# Patient Record
Sex: Female | Born: 1995 | Hispanic: Yes | Marital: Single | Smoking: Former smoker
Health system: Southern US, Community
[De-identification: ages and names within clinical notes are randomized; demographics above are authoritative.]

## PROBLEM LIST (undated history)

## (undated) DIAGNOSIS — D649 Anemia, unspecified: Secondary | ICD-10-CM

## (undated) DIAGNOSIS — F4310 Post-traumatic stress disorder, unspecified: Secondary | ICD-10-CM

## (undated) DIAGNOSIS — J45909 Unspecified asthma, uncomplicated: Secondary | ICD-10-CM

## (undated) DIAGNOSIS — M797 Fibromyalgia: Secondary | ICD-10-CM

## (undated) DIAGNOSIS — F329 Major depressive disorder, single episode, unspecified: Secondary | ICD-10-CM

## (undated) DIAGNOSIS — E559 Vitamin D deficiency, unspecified: Secondary | ICD-10-CM

## (undated) DIAGNOSIS — E611 Iron deficiency: Secondary | ICD-10-CM

## (undated) DIAGNOSIS — K2970 Gastritis, unspecified, without bleeding: Secondary | ICD-10-CM

## (undated) DIAGNOSIS — F419 Anxiety disorder, unspecified: Secondary | ICD-10-CM

## (undated) DIAGNOSIS — F32A Depression, unspecified: Secondary | ICD-10-CM

## (undated) HISTORY — DX: Fibromyalgia: M79.7

## (undated) HISTORY — DX: Anxiety disorder, unspecified: F41.9

## (undated) HISTORY — DX: Iron deficiency: E61.1

## (undated) HISTORY — DX: Gastritis, unspecified, without bleeding: K29.70

## (undated) HISTORY — PX: NO PAST SURGERIES: SHX2092

## (undated) HISTORY — DX: Vitamin D deficiency, unspecified: E55.9

---

## 2015-08-11 ENCOUNTER — Encounter (HOSPITAL_COMMUNITY): Payer: Self-pay

## 2015-08-11 ENCOUNTER — Emergency Department (HOSPITAL_COMMUNITY): Payer: Self-pay

## 2015-08-11 ENCOUNTER — Emergency Department (HOSPITAL_COMMUNITY)
Admission: EM | Admit: 2015-08-11 | Discharge: 2015-08-11 | Disposition: A | Discharge status: Home / Self Care | Payer: Self-pay | Attending: Emergency Medicine | Admitting: Emergency Medicine

## 2015-08-11 DIAGNOSIS — Z3202 Encounter for pregnancy test, result negative: Secondary | ICD-10-CM | POA: Insufficient documentation

## 2015-08-11 DIAGNOSIS — R197 Diarrhea, unspecified: Secondary | ICD-10-CM | POA: Insufficient documentation

## 2015-08-11 DIAGNOSIS — J45909 Unspecified asthma, uncomplicated: Secondary | ICD-10-CM | POA: Insufficient documentation

## 2015-08-11 DIAGNOSIS — Z862 Personal history of diseases of the blood and blood-forming organs and certain disorders involving the immune mechanism: Secondary | ICD-10-CM | POA: Insufficient documentation

## 2015-08-11 DIAGNOSIS — F17210 Nicotine dependence, cigarettes, uncomplicated: Secondary | ICD-10-CM | POA: Insufficient documentation

## 2015-08-11 DIAGNOSIS — Z8659 Personal history of other mental and behavioral disorders: Secondary | ICD-10-CM | POA: Insufficient documentation

## 2015-08-11 DIAGNOSIS — R1084 Generalized abdominal pain: Secondary | ICD-10-CM | POA: Insufficient documentation

## 2015-08-11 DIAGNOSIS — R112 Nausea with vomiting, unspecified: Secondary | ICD-10-CM | POA: Insufficient documentation

## 2015-08-11 HISTORY — DX: Major depressive disorder, single episode, unspecified: F32.9

## 2015-08-11 HISTORY — DX: Anemia, unspecified: D64.9

## 2015-08-11 HISTORY — DX: Unspecified asthma, uncomplicated: J45.909

## 2015-08-11 HISTORY — DX: Post-traumatic stress disorder, unspecified: F43.10

## 2015-08-11 HISTORY — DX: Depression, unspecified: F32.A

## 2015-08-11 LAB — COMPREHENSIVE METABOLIC PANEL
ALT: 15 U/L (ref 14–54)
AST: 19 U/L (ref 15–41)
Albumin: 4.2 g/dL (ref 3.5–5.0)
Alkaline Phosphatase: 95 U/L (ref 38–126)
Anion gap: 12 (ref 5–15)
BUN: 11 mg/dL (ref 6–20)
CHLORIDE: 105 mmol/L (ref 101–111)
CO2: 22 mmol/L (ref 22–32)
CREATININE: 0.64 mg/dL (ref 0.44–1.00)
Calcium: 9.2 mg/dL (ref 8.9–10.3)
GFR calc non Af Amer: >60 mL/min (ref >60)
Glucose, Bld: 110 mg/dL — ABNORMAL HIGH (ref 65–99)
Potassium: 4.2 mmol/L (ref 3.5–5.1)
SODIUM: 139 mmol/L (ref 135–145)
Total Bilirubin: 0.6 mg/dL (ref 0.3–1.2)
Total Protein: 8.2 g/dL — ABNORMAL HIGH (ref 6.5–8.1)

## 2015-08-11 LAB — URINALYSIS, ROUTINE W REFLEX MICROSCOPIC
Bilirubin Urine: NEGATIVE
GLUCOSE, UA: NEGATIVE mg/dL
HGB URINE DIPSTICK: NEGATIVE
Ketones, ur: NEGATIVE mg/dL
LEUKOCYTES UA: NEGATIVE
Nitrite: NEGATIVE
PH: 5.5 (ref 5.0–8.0)
PROTEIN: NEGATIVE mg/dL
SPECIFIC GRAVITY, URINE: 1.03 (ref 1.005–1.030)

## 2015-08-11 LAB — CBC
HEMATOCRIT: 38.4 % (ref 36.0–46.0)
Hemoglobin: 12.1 g/dL (ref 12.0–15.0)
MCH: 21.4 pg — AB (ref 26.0–34.0)
MCHC: 31.5 g/dL (ref 30.0–36.0)
MCV: 68 fL — AB (ref 78.0–100.0)
PLATELETS: 421 10*3/uL — AB (ref 150–400)
RBC: 5.65 MIL/uL — AB (ref 3.87–5.11)
RDW: 15.2 % (ref 11.5–15.5)
WBC: 9.7 10*3/uL (ref 4.0–10.5)

## 2015-08-11 LAB — I-STAT BETA HCG BLOOD, ED (MC, WL, AP ONLY): I-stat hCG, quantitative: <5 m[IU]/mL (ref <5)

## 2015-08-11 LAB — LIPASE, BLOOD: LIPASE: 24 U/L (ref 11–51)

## 2015-08-11 MED ORDER — SODIUM CHLORIDE 0.9 % IV BOLUS (SEPSIS): 1000.0000 mL | Freq: Once | INTRAVENOUS | Status: DC

## 2015-08-11 MED ORDER — ONDANSETRON 4 MG PO TBDP
4.0000 mg | ORAL_TABLET | Freq: Once | ORAL | Status: DC | PRN
  Filled 2015-08-11: qty 1

## 2015-08-11 MED ORDER — FENTANYL CITRATE (PF) 100 MCG/2ML IJ SOLN
100.0000 ug | Freq: Once | INTRAMUSCULAR | Status: DC
Start: 2015-08-11 — End: 2015-08-11
  Filled 2015-08-11: qty 2

## 2015-08-11 MED ORDER — ONDANSETRON HCL 4 MG/2ML IJ SOLN
4.0000 mg | Freq: Once | INTRAMUSCULAR | Status: DC
  Filled 2015-08-11: qty 2

## 2015-08-11 MED ORDER — ONDANSETRON HCL 4 MG PO TABS: 4.0000 mg | ORAL_TABLET | Freq: Four times a day (QID) | ORAL | Status: DC

## 2015-08-11 MED ORDER — NORGESTIM-ETH ESTRAD TRIPHASIC 0.18/0.215/0.25 MG-35 MCG PO TABS: 1.0000 | ORAL_TABLET | Freq: Every day | ORAL | Status: DC

## 2015-08-11 NOTE — ED Notes (Signed)
Patient does not want an IV.

## 2015-08-11 NOTE — ED Notes (Signed)
Pt c/o generalized abdominal pain and n/v x 1 day and diarrhea starting this morning.  Pain score 4/10.  Pt reports movement and intake makes pain worse.  Pt has not taken anything for symptoms.

## 2015-08-11 NOTE — ED Notes (Signed)
I have attempted IV placement x 3 with no success.  Our C.N., Johna RolesJakeema will attempt shortly.

## 2015-08-11 NOTE — ED Notes (Signed)
Ultrasound machine at bedside

## 2015-08-11 NOTE — ED Provider Notes (Signed)
CSN: [id number]     Arrival date & time 08/11/15  1252 History   First MD Initiated Contact with Patient 08/11/15 1734     Chief Complaint  Patient presents with  . Abdominal Pain  . Emesis  . Diarrhea     (Consider location/radiation/quality/duration/timing/severity/associated sxs/prior Treatment) Patient is a 20 y.o. female presenting with abdominal pain.  Abdominal Pain Pain location:  Generalized Pain quality: aching   Pain quality: not shooting, not squeezing and no stiffness   Pain radiates to:  Does not radiate Pain severity:  Mild Onset quality:  Gradual Timing:  Constant Progression:  Worsening Chronicity:  New Context: not alcohol use, not diet changes, not recent travel and not retching   Relieved by:  None tried Worsened by:  Nothing tried Ineffective treatments:  None tried Associated symptoms: diarrhea, nausea and vomiting   Associated symptoms: no chest pain, no cough, no dysuria, no fever and no shortness of breath     Past Medical History  Diagnosis Date  . PTSD (post-traumatic stress disorder)   . Bipolar 2 disorder (HCC)   . Depression   . Generalized anxiety disorder   . Asthma   . Anemia    History reviewed. No pertinent past surgical history. History reviewed. No pertinent family history. Social History  Substance Use Topics  . Smoking status: Current Every Day Smoker    Types: Cigarettes  . Smokeless tobacco: None  . Alcohol Use: Yes   OB History    No data available     Review of Systems  Constitutional: Negative for fever.  HENT: Negative for congestion and facial swelling.   Eyes: Negative for discharge and redness.  Respiratory: Negative for cough and shortness of breath.   Cardiovascular: Negative for chest pain.  Gastrointestinal: Positive for nausea, vomiting, abdominal pain and diarrhea. Negative for abdominal distention.  Endocrine: Negative for polydipsia.  Genitourinary: Negative for dysuria.  Musculoskeletal: Negative for  back pain.  Skin: Negative for wound.  Neurological: Negative for headaches.  All other systems reviewed and are negative.     Allergies  Review of patient's allergies indicates no known allergies.  Home Medications   Prior to Admission medications   Medication Sig Start Date End Date Taking? Authorizing Provider  Norgestimate-Ethinyl Estradiol Triphasic (ORTHO TRI-CYCLEN, 28,) 0.18/0.215/0.25 MG-35 MCG tablet Take 1 tablet by mouth daily. 08/11/15   Marily Memos, MD  ondansetron (ZOFRAN) 4 MG tablet Take 1 tablet (4 mg total) by mouth every 6 (six) hours. 08/11/15   Marily Memos, MD   BP 119/78 mmHg  Pulse 94  Temp(Src) 98.6 F (37 C) (Oral)  Resp 16  SpO2 100%  LMP 08/09/2015 Physical Exam  Constitutional: She is oriented to person, place, and time. She appears well-developed and well-nourished.  HENT:  Head: Normocephalic and atraumatic.  Neck: Normal range of motion.  Cardiovascular: Normal rate and regular rhythm.   Pulmonary/Chest: Effort normal and breath sounds normal. No stridor. No respiratory distress. She has no wheezes. She has no rales.  Abdominal: Soft. Bowel sounds are normal. She exhibits no distension. There is tenderness. There is no rebound and no guarding.  Musculoskeletal: Normal range of motion. She exhibits no edema or tenderness.  Neurological: She is alert and oriented to person, place, and time. No cranial nerve deficit.  Skin: Skin is warm and dry.  Nursing note and vitals reviewed.   ED Course  Procedures (including critical care time) Labs Review Labs Reviewed  COMPREHENSIVE METABOLIC PANEL - Abnormal; Notable for  the following:    Glucose, Bld 110 (*)    Total Protein 8.2 (*)    All other components within normal limits  CBC - Abnormal; Notable for the following:    RBC 5.65 (*)    MCV 68.0 (*)    MCH 21.4 (*)    Platelets 421 (*)    All other components within normal limits  URINALYSIS, ROUTINE W REFLEX MICROSCOPIC (NOT AT [facility]) -  Abnormal; Notable for the following:    Color, Urine AMBER (*)    APPearance CLOUDY (*)    All other components within normal limits  LIPASE, BLOOD  I-STAT BETA HCG BLOOD, ED ([facility], [facility], [facility] ONLY)    Imaging Review No results found. I have personally reviewed and evaluated these images and lab results as part of my medical decision-making.   EKG Interpretation None      MDM   Final diagnoses:  Non-intractable vomiting with nausea, vomiting of unspecified type  Diarrhea, unspecified type   Pain then vomiting and diarrhea. Abdomen ttp epigastric, ruq, luq. Unsure of cause. Labs ok. But 2/2 ttp will ct to ensure no surgical causes for symptoms.   Symptoms resolved. Abdomen exam improved. Patient not wanting to stay longer or be stuck for IV again. Likely gastroenteritis with resolution. Will return here for new/worsening symptoms.   New Prescriptions: New Prescriptions   NORGESTIMATE-ETHINYL ESTRADIOL TRIPHASIC (ORTHO TRI-CYCLEN, 28,) 0.18/0.215/0.25 MG-35 MCG TABLET    Take 1 tablet by mouth daily.   ONDANSETRON (ZOFRAN) 4 MG TABLET    Take 1 tablet (4 mg total) by mouth every 6 (six) hours.   I have personally and contemperaneously reviewed labs and imaging and used in my decision making as above.   A medical screening exam was performed and I feel the patient has had an appropriate workup for their chief complaint at this time and likelihood of emergent condition existing is low. Their vital signs are stable. They have been counseled on decision, discharge, follow up and which symptoms necessitate immediate return to the emergency department.  They verbally stated understanding and agreement with plan and discharged in stable condition.      Marily MemosJason Hermila Millis, MD 08/11/15 2027

## 2015-08-29 ENCOUNTER — Encounter (HOSPITAL_COMMUNITY): Payer: Self-pay | Admitting: Emergency Medicine

## 2015-08-29 ENCOUNTER — Emergency Department (HOSPITAL_COMMUNITY)
Admission: EM | Admit: 2015-08-29 | Discharge: 2015-08-30 | Disposition: A | Discharge status: Home / Self Care | Payer: Self-pay | Attending: Emergency Medicine | Admitting: Emergency Medicine

## 2015-08-29 DIAGNOSIS — J45909 Unspecified asthma, uncomplicated: Secondary | ICD-10-CM | POA: Insufficient documentation

## 2015-08-29 DIAGNOSIS — R8281 Pyuria: Secondary | ICD-10-CM

## 2015-08-29 DIAGNOSIS — Z862 Personal history of diseases of the blood and blood-forming organs and certain disorders involving the immune mechanism: Secondary | ICD-10-CM | POA: Insufficient documentation

## 2015-08-29 DIAGNOSIS — M545 Low back pain, unspecified: Secondary | ICD-10-CM

## 2015-08-29 DIAGNOSIS — F17210 Nicotine dependence, cigarettes, uncomplicated: Secondary | ICD-10-CM | POA: Insufficient documentation

## 2015-08-29 DIAGNOSIS — Z3202 Encounter for pregnancy test, result negative: Secondary | ICD-10-CM | POA: Insufficient documentation

## 2015-08-29 DIAGNOSIS — Z79818 Long term (current) use of other agents affecting estrogen receptors and estrogen levels: Secondary | ICD-10-CM | POA: Insufficient documentation

## 2015-08-29 DIAGNOSIS — R739 Hyperglycemia, unspecified: Secondary | ICD-10-CM

## 2015-08-29 DIAGNOSIS — Z8659 Personal history of other mental and behavioral disorders: Secondary | ICD-10-CM | POA: Insufficient documentation

## 2015-08-29 DIAGNOSIS — N390 Urinary tract infection, site not specified: Secondary | ICD-10-CM | POA: Insufficient documentation

## 2015-08-29 NOTE — ED Notes (Signed)
Pt c/o low back pain, body aches, chills and urinary frequency

## 2015-08-30 LAB — URINALYSIS, ROUTINE W REFLEX MICROSCOPIC
Bilirubin Urine: NEGATIVE
GLUCOSE, UA: NEGATIVE mg/dL
Hgb urine dipstick: NEGATIVE
Ketones, ur: NEGATIVE mg/dL
Nitrite: NEGATIVE
PH: 6 (ref 5.0–8.0)
PROTEIN: NEGATIVE mg/dL
Specific Gravity, Urine: 1.019 (ref 1.005–1.030)

## 2015-08-30 LAB — BASIC METABOLIC PANEL
Anion gap: 6 (ref 5–15)
BUN: 12 mg/dL (ref 6–20)
CHLORIDE: 110 mmol/L (ref 101–111)
CO2: 22 mmol/L (ref 22–32)
CREATININE: 0.6 mg/dL (ref 0.44–1.00)
Calcium: 8.6 mg/dL — ABNORMAL LOW (ref 8.9–10.3)
GFR calc Af Amer: >60 mL/min (ref >60)
GFR calc non Af Amer: >60 mL/min (ref >60)
GLUCOSE: 122 mg/dL — AB (ref 65–99)
POTASSIUM: 3.8 mmol/L (ref 3.5–5.1)
Sodium: 138 mmol/L (ref 135–145)

## 2015-08-30 LAB — CBC WITH DIFFERENTIAL/PLATELET
Basophils Absolute: 0 10*3/uL (ref 0.0–0.1)
Basophils Relative: 0 %
EOS ABS: 0.5 10*3/uL (ref 0.0–0.7)
EOS PCT: 4 %
HCT: 33.9 % — ABNORMAL LOW (ref 36.0–46.0)
Hemoglobin: 10.8 g/dL — ABNORMAL LOW (ref 12.0–15.0)
LYMPHS ABS: 2.8 10*3/uL (ref 0.7–4.0)
Lymphocytes Relative: 21 %
MCH: 21.6 pg — AB (ref 26.0–34.0)
MCHC: 31.9 g/dL (ref 30.0–36.0)
MCV: 67.8 fL — ABNORMAL LOW (ref 78.0–100.0)
MONO ABS: 0.5 10*3/uL (ref 0.1–1.0)
MONOS PCT: 4 %
Neutro Abs: 9.5 10*3/uL — ABNORMAL HIGH (ref 1.7–7.7)
Neutrophils Relative %: 71 %
PLATELETS: 413 10*3/uL — AB (ref 150–400)
RBC: 5 MIL/uL (ref 3.87–5.11)
RDW: 15.8 % — AB (ref 11.5–15.5)
WBC: 13.3 10*3/uL — AB (ref 4.0–10.5)

## 2015-08-30 LAB — URINE MICROSCOPIC-ADD ON: RBC / HPF: NONE SEEN RBC/hpf (ref 0–5)

## 2015-08-30 LAB — I-STAT BETA HCG BLOOD, ED (MC, WL, AP ONLY)

## 2015-08-30 MED ORDER — CYCLOBENZAPRINE HCL 10 MG PO TABS: 10.0000 mg | ORAL_TABLET | Freq: Two times a day (BID) | ORAL | Status: DC | PRN

## 2015-08-30 MED ORDER — NAPROXEN 500 MG PO TABS: 500.0000 mg | ORAL_TABLET | Freq: Two times a day (BID) | ORAL | Status: DC

## 2015-08-30 NOTE — ED Provider Notes (Signed)
CSN: [id number]     Arrival date & time 08/29/15  2321 History   First MD Initiated Contact with Patient 08/29/15 2341     Chief Complaint  Patient presents with  . Urinary Frequency     (Consider location/radiation/quality/duration/timing/severity/associated sxs/prior Treatment) HPI   Wanda Garrett is a(n) 20 y.o. female who presents to the ED with cc of back pain and urinary frequency.  The patient states that she is, she had low back pain for the past week. She states that it's worse with standing, better with sitting. She also has urinary frequency and some dysuria. Patient states she's developed some body aches and has noticed some foul odor of her urine. Patient denies any recent injuries to her back. She denies a history of problems with her back. She denies fevers. She denies any vaginal symptoms  Past Medical History  Diagnosis Date  . PTSD (post-traumatic stress disorder)   . Bipolar 2 disorder (HCC)   . Depression   . Generalized anxiety disorder   . Asthma   . Anemia    History reviewed. No pertinent past surgical history. No family history on file. Social History  Substance Use Topics  . Smoking status: Current Every Day Smoker    Types: Cigarettes  . Smokeless tobacco: None  . Alcohol Use: Yes   OB History    No data available     Review of Systems  Ten systems reviewed and are negative for acute change, except as noted in the HPI.     Allergies  Review of patient's allergies indicates no known allergies.  Home Medications   Prior to Admission medications   Medication Sig Start Date End Date Taking? Authorizing Provider  Norgestimate-Ethinyl Estradiol Triphasic (ORTHO TRI-CYCLEN, 28,) 0.18/0.215/0.25 MG-35 MCG tablet Take 1 tablet by mouth daily. 08/11/15   Marily MemosJason Mesner, MD  ondansetron (ZOFRAN) 4 MG tablet Take 1 tablet (4 mg total) by mouth every 6 (six) hours. 08/11/15   Marily MemosJason Mesner, MD   BP 119/87 mmHg  Pulse 100  Temp(Src) 98 F (36.7 C) (Oral)   Resp 20  Ht 5\' 5"  (1.651 m)  Wt 113.399 kg  BMI 41.60 kg/m2  SpO2 99%  LMP 08/09/2015 Physical Exam  Constitutional: She is oriented to person, place, and time. She appears well-developed and well-nourished. No distress.  obese  HENT:  Head: Normocephalic and atraumatic.  Eyes: Conjunctivae are normal. No scleral icterus.  Neck: Normal range of motion.  Cardiovascular: Normal rate, regular rhythm and normal heart sounds.  Exam reveals no gallop and no friction rub.   No murmur heard. Pulmonary/Chest: Effort normal and breath sounds normal. No respiratory distress.  Abdominal: Soft. Bowel sounds are normal. She exhibits no distension and no mass. There is no tenderness. There is no guarding.  Musculoskeletal:  TTP lumbar paraspinal muscles.  Neurological: She is alert and oriented to person, place, and time.  Skin: Skin is warm and dry. She is not diaphoretic.  Nursing note and vitals reviewed.   ED Course  Procedures (including critical care time) Labs Review Labs Reviewed  BASIC METABOLIC PANEL - Abnormal; Notable for the following:    Glucose, Bld 122 (*)    Calcium 8.6 (*)    All other components within normal limits  CBC WITH DIFFERENTIAL/PLATELET - Abnormal; Notable for the following:    WBC 13.3 (*)    Hemoglobin 10.8 (*)    HCT 33.9 (*)    MCV 67.8 (*)    MCH 21.6 (*)  RDW 15.8 (*)    Platelets 413 (*)    Neutro Abs 9.5 (*)    All other components within normal limits  URINALYSIS, ROUTINE W REFLEX MICROSCOPIC (NOT AT [facility]) - Abnormal; Notable for the following:    Leukocytes, UA SMALL (*)    All other components within normal limits  URINE MICROSCOPIC-ADD ON - Abnormal; Notable for the following:    Squamous Epithelial / LPF 0-5 (*)    Bacteria, UA RARE (*)    All other components within normal limits  I-STAT BETA HCG BLOOD, ED ([facility], [facility], [facility] ONLY)    Imaging Review No results found. I have personally reviewed and evaluated these images and lab results as  part of my medical decision-making.   EKG Interpretation None      MDM   Final diagnoses:  None    Patient with back pain.  No neurological deficits and normal neuro exam.  Patient can walk but states is painful.  No loss of bowel or bladder control.  No concern for cauda equina.  No fever, night sweats, weight loss, h/o cancer, IVDU.  RICE protocol and pain medicine indicated and discussed with patient.  +for pyuria. Will treat for infection. UA sent for culture. Appears safe for discharge.    Arthor Captain, PA-C 08/30/15 0454  Derwood Kaplan, MD 08/30/15 [phone]

## 2015-08-30 NOTE — ED Notes (Signed)
PA at bedside.

## 2015-08-30 NOTE — ED Notes (Signed)
Patient c/o low back pain and urinary frequency with a sensation of fullness to her bladder x1 week. Denies pain with urination. Patient reports a hx of UTI and describes this as a similar pain, with the difference being the generalized body aches. Denies fevers. Patient using cranberry and tylenol at home for symptom relief.

## 2015-08-30 NOTE — ED Notes (Signed)
Patient d/c'd self care.  F/U and medications reviewed.  Patient verbalized understanding. 

## 2015-08-30 NOTE — Discharge Instructions (Signed)
Back Exercises The following exercises strengthen the muscles that help to support the back. They also help to keep the lower back flexible. Doing these exercises can help to prevent back pain or lessen existing pain. If you have back pain or discomfort, try doing these exercises 2-3 times each day or as told by your health care provider. When the pain goes away, do them once each day, but increase the number of times that you repeat the steps for each exercise (do more repetitions). If you do not have back pain or discomfort, do these exercises once each day or as told by your health care provider. EXERCISES Single Knee to Chest Repeat these steps 3-5 times for each leg:  Lie on your back on a firm bed or the floor with your legs extended.  Bring one knee to your chest. Your other leg should stay extended and in contact with the floor.  Hold your knee in place by grabbing your knee or thigh.  Pull on your knee until you feel a gentle stretch in your lower back.  Hold the stretch for 10-30 seconds.  Slowly release and straighten your leg. Pelvic Tilt Repeat these steps 5-10 times:  Lie on your back on a firm bed or the floor with your legs extended.  Bend your knees so they are pointing toward the ceiling and your feet are flat on the floor.  Tighten your lower abdominal muscles to press your lower back against the floor. This motion will tilt your pelvis so your tailbone points up toward the ceiling instead of pointing to your feet or the floor.  With gentle tension and even breathing, hold this position for 5-10 seconds. Cat-Cow Repeat these steps until your lower back becomes more flexible:  Get into a hands-and-knees position on a firm surface. Keep your hands under your shoulders, and keep your knees under your hips. You may place padding under your knees for comfort.  Let your head hang down, and point your tailbone toward the floor so your lower back becomes rounded like the  back of a cat.  Hold this position for 5 seconds.  Slowly lift your head and point your tailbone up toward the ceiling so your back forms a sagging arch like the back of a cow.  Hold this position for 5 seconds. Press-Ups Repeat these steps 5-10 times:  Lie on your abdomen (face-down) on the floor.  Place your palms near your head, about shoulder-width apart.  While you keep your back as relaxed as possible and keep your hips on the floor, slowly straighten your arms to raise the top half of your body and lift your shoulders. Do not use your back muscles to raise your upper torso. You may adjust the placement of your hands to make yourself more comfortable.  Hold this position for 5 seconds while you keep your back relaxed.  Slowly return to lying flat on the floor. Bridges Repeat these steps 10 times:  Lie on your back on a firm surface.  Bend your knees so they are pointing toward the ceiling and your feet are flat on the floor.  Tighten your buttocks muscles and lift your buttocks off of the floor until your waist is at almost the same height as your knees. You should feel the muscles working in your buttocks and the back of your thighs. If you do not feel these muscles, slide your feet 1-2 inches farther away from your buttocks.  Hold this position for 3-5  seconds.  Slowly lower your hips to the starting position, and allow your buttocks muscles to relax completely. If this exercise is too easy, try doing it with your arms crossed over your chest. Abdominal Crunches Repeat these steps 5-10 times:  Lie on your back on a firm bed or the floor with your legs extended.  Bend your knees so they are pointing toward the ceiling and your feet are flat on the floor.  Cross your arms over your chest.  Tip your chin slightly toward your chest without bending your neck.  Tighten your abdominal muscles and slowly raise your trunk (torso) high enough to lift your shoulder blades a  tiny bit off of the floor. Avoid raising your torso higher than that, because it can put too much stress on your low back and it does not help to strengthen your abdominal muscles.  Slowly return to your starting position. Back Lifts Repeat these steps 5-10 times: 1. Lie on your abdomen (face-down) with your arms at your sides, and rest your forehead on the floor. 2. Tighten the muscles in your legs and your buttocks. 3. Slowly lift your chest off of the floor while you keep your hips pressed to the floor. Keep the back of your head in line with the curve in your back. Your eyes should be looking at the floor. 4. Hold this position for 3-5 seconds. 5. Slowly return to your starting position. SEEK MEDICAL CARE IF:  Your back pain or discomfort gets much worse when you do an exercise.  Your back pain or discomfort does not lessen within 2 hours after you exercise. If you have any of these problems, stop doing these exercises right away. Do not do them again unless your health care provider says that you can. SEEK IMMEDIATE MEDICAL CARE IF:  You develop sudden, severe back pain. If this happens, stop doing the exercises right away. Do not do them again unless your health care provider says that you can.   This information is not intended to replace advice given to you by your health care provider. Make sure you discuss any questions you have with your health care provider.   Document Released: 06/01/2004 Document Revised: 01/13/2015 Document Reviewed: 06/18/2014 Elsevier Interactive Patient Education 2016 [place] Injury Prevention Back injuries can be very painful. They can also be difficult to heal. After having one back injury, you are more likely to injure your back again. It is important to learn how to avoid injuring or re-injuring your back. The following tips can help you to prevent a back injury. WHAT SHOULD I KNOW ABOUT PHYSICAL FITNESS?  Exercise for 30 minutes per  day on most days of the week or as directed by your health care provider. Make sure to:  Do aerobic exercises, such as walking, jogging, biking, or swimming.  Do exercises that increase balance and strength, such as tai chi and yoga. These can decrease your risk of falling and injuring your back.  Do stretching exercises to help with flexibility.  Try to develop strong abdominal muscles. Your abdominal muscles provide a lot of the support that is needed by your back.  Maintain a healthy weight. This helps to decrease your risk of a back injury. WHAT SHOULD I KNOW ABOUT MY DIET?  Talk with your health care provider about your overall diet. Take supplements and vitamins only as directed by your health care provider.  Talk with your health care provider about how much calcium and  vitamin D you need each day. These nutrients help to prevent weakening of the bones (osteoporosis). Osteoporosis can cause broken (fractured) bones, which lead to back pain. °· Include good sources of calcium in your diet, such as dairy products, green leafy vegetables, and products that have had calcium added to them (fortified). °· Include good sources of vitamin D in your diet, such as milk and foods that are fortified with vitamin D. °WHAT SHOULD I KNOW ABOUT MY POSTURE? °· Sit up straight and stand up straight. Avoid leaning forward when you sit or hunching over when you stand. °· Choose chairs that have good low-back (lumbar) support. °· If you work at a desk, sit close to it so you do not need to lean over. Keep your chin tucked in. Keep your neck drawn back, and keep your elbows bent at a right angle. Your arms should look like the letter "L." °· Sit high and close to the steering wheel when you drive. Add a lumbar support to your car seat, if needed. °· Avoid sitting or standing in one position for very long. Take breaks to get up, stretch, and walk around at least one time every hour. Take breaks every hour if you are  driving for long periods of time. °· Sleep on your side with your knees slightly bent, or sleep on your back with a pillow under your knees. Do not lie on the front of your body to sleep. °WHAT SHOULD I KNOW ABOUT LIFTING, TWISTING, AND REACHING? °Lifting and Heavy Lifting °· Avoid heavy lifting, especially repetitive heavy lifting. If you must do heavy lifting: °· Stretch before lifting. °· Work slowly. °· Rest between lifts. °· Use a tool such as a cart or a dolly to move objects if one is available. °· Make several small trips instead of carrying one heavy load. °· Ask for help when you need it, especially when moving big objects. °· Follow these steps when lifting: °· Stand with your feet shoulder-width apart. °· Get as close to the object as you can. Do not try to pick up a heavy object that is far from your body. °· Use handles or lifting straps if they are available. °· Bend at your knees. Squat down, but keep your heels off the floor. °· Keep your shoulders pulled back, your chin tucked in, and your back straight. °· Lift the object slowly while you tighten the muscles in your legs, abdomen, and buttocks. Keep the object as close to the center of your body as possible. °· Follow these steps when putting down a heavy load: °· Stand with your feet shoulder-width apart. °· Lower the object slowly while you tighten the muscles in your legs, abdomen, and buttocks. Keep the object as close to the center of your body as possible. °· Keep your shoulders pulled back, your chin tucked in, and your back straight. °· Bend at your knees. Squat down, but keep your heels off the floor. °· Use handles or lifting straps if they are available. °Twisting and Reaching °· Avoid lifting heavy objects above your waist. °· Do not twist at your waist while you are lifting or carrying a load. If you need to turn, move your feet. °· Do not bend over without bending at your knees. °· Avoid reaching over your head, across a table, or  for an object on a high surface. °WHAT ARE SOME OTHER TIPS? °· Avoid wet floors and icy ground. Keep sidewalks clear of ice to prevent   falls.  Do not sleep on a mattress that is too soft or too hard.  Keep items that are used frequently within easy reach.  Put heavier objects on shelves at waist level, and put lighter objects on lower or higher shelves.  Find ways to decrease your stress, such as exercise, massage, or relaxation techniques. Stress can build up in your muscles. Tense muscles are more vulnerable to injury.  Talk with your health care provider if you feel anxious or depressed. These conditions can make back pain worse.  Wear flat heel shoes with cushioned soles.  Avoid sudden movements.  Use both shoulder straps when carrying a backpack.  Do not use any tobacco products, including cigarettes, chewing tobacco, or electronic cigarettes. If you need help quitting, ask your health care provider.   This information is not intended to replace advice given to you by your health care provider. Make sure you discuss any questions you have with your health care provider.   Document Released: 06/01/2004 Document Revised: 09/08/2014 Document Reviewed: 04/28/2014 Elsevier Interactive Patient Education 2016 Else[employer] Inc[employer]  Dysuria Dysuria is pain or discomfort while urinating. The pain or discomfort may be felt in the tube that carries urine out of the bladder (urethra) or in the surrounding tissue of the genitals. The pain may also be felt in the groin area, lower abdomen, and lower back. You may have to urinate frequently or have the sudden feeling that you have to urinate (urgency). Dysuria can affect both men and women, but is more common in women. Dysuria can be caused by many different things, including:  Urinary tract infection in women.  Infection of the kidney or bladder.  Kidney stones or bladder stones.  Certain sexually transmitted infections (STIs), such as  chlamydia.  Dehydration.  Inflammation of the vagina.  Use of certain medicines.  Use of certain soaps or scented products that cause irritation. HOME CARE INSTRUCTIONS Watch your dysuria for any changes. The following actions may help to reduce any discomfort you are feeling:  Drink enough fluid to keep your urine clear or pale yellow.  Empty your bladder often. Avoid holding urine for long periods of time.  After a bowel movement or urination, women should cleanse from front to back, using each tissue only once.  Empty your bladder after sexual intercourse.  Take medicines only as directed by your health care provider.  If you were prescribed an antibiotic medicine, finish it all even if you start to feel better.  Avoid caffeine, tea, and alcohol. They can irritate the bladder and make dysuria worse. In men, alcohol may irritate the prostate.  Keep all follow-up visits as directed by your health care provider. This is important.  If you had any tests done to find the cause of dysuria, it is your responsibility to obtain your test results. Ask the lab or department performing the test when and how you will get your results. Talk with your health care provider if you have any questions about your results. SEEK MEDICAL CARE IF:  You develop pain in your back or sides.  You have a fever.  You have nausea or vomiting.  You have blood in your urine.  You are not urinating as often as you usually do. SEEK IMMEDIATE MEDICAL CARE IF:  You pain is severe and not relieved with medicines.  You are unable to hold down any fluids.  You or someone else notices a change in your mental function.  You have a rapid heartbeat  at rest.  You have shaking or chills.  You feel extremely weak.   This information is not intended to replace advice given to you by your health care provider. Make sure you discuss any questions you have with your health care provider.   Document Released:  01/21/2004 Document Revised: 05/15/2014 Document Reviewed: 12/18/2013 Elsevier Interactive Patient Education 2016 Elsevier Inc.  Dysuria Dysuria is pain or discomfort while urinating. The pain or discomfort may be felt in the tube that carries urine out of the bladder (urethra) or in the surrounding tissue of the genitals. The pain may also be felt in the groin area, lower abdomen, and lower back. You may have to urinate frequently or have the sudden feeling that you have to urinate (urgency). Dysuria can affect both men and women, but is more common in women. Dysuria can be caused by many different things, including:  Urinary tract infection in women.  Infection of the kidney or bladder.  Kidney stones or bladder stones.  Certain sexually transmitted infections (STIs), such as chlamydia.  Dehydration.  Inflammation of the vagina.  Use of certain medicines.  Use of certain soaps or scented products that cause irritation. HOME CARE INSTRUCTIONS Watch your dysuria for any changes. The following actions may help to reduce any discomfort you are feeling:  Drink enough fluid to keep your urine clear or pale yellow.  Empty your bladder often. Avoid holding urine for long periods of time.  After a bowel movement or urination, women should cleanse from front to back, using each tissue only once.  Empty your bladder after sexual intercourse.  Take medicines only as directed by your health care provider.  If you were prescribed an antibiotic medicine, finish it all even if you start to feel better.  Avoid caffeine, tea, and alcohol. They can irritate the bladder and make dysuria worse. In men, alcohol may irritate the prostate.  Keep all follow-up visits as directed by your health care provider. This is important.  If you had any tests done to find the cause of dysuria, it is your responsibility to obtain your test results. Ask the lab or department performing the test when and how  you will get your results. Talk with your health care provider if you have any questions about your results. SEEK MEDICAL CARE IF:  You develop pain in your back or sides.  You have a fever.  You have nausea or vomiting.  You have blood in your urine.  You are not urinating as often as you usually do. SEEK IMMEDIATE MEDICAL CARE IF:  You pain is severe and not relieved with medicines.  You are unable to hold down any fluids.  You or someone else notices a change in your mental function.  You have a rapid heartbeat at rest.  You have shaking or chills.  You feel extremely weak.   This information is not intended to replace advice given to you by your health care provider. Make sure you discuss any questions you have with your health care provider.   Document Released: 01/21/2004 Document Revised: 05/15/2014 Document Reviewed: 12/18/2013 Elsevier Interactive Patient Education [employer].

## 2015-09-01 LAB — URINE CULTURE: SPECIAL REQUESTS: NORMAL

## 2016-07-17 ENCOUNTER — Emergency Department (HOSPITAL_COMMUNITY)
Admission: EM | Admit: 2016-07-17 | Discharge: 2016-07-17 | Disposition: A | Discharge status: Home / Self Care | Payer: BLUE CROSS/BLUE SHIELD | Attending: Emergency Medicine | Admitting: Emergency Medicine

## 2016-07-17 ENCOUNTER — Encounter (HOSPITAL_COMMUNITY): Payer: Self-pay | Admitting: *Deleted

## 2016-07-17 DIAGNOSIS — K0889 Other specified disorders of teeth and supporting structures: Secondary | ICD-10-CM | POA: Diagnosis not present

## 2016-07-17 DIAGNOSIS — Z87891 Personal history of nicotine dependence: Secondary | ICD-10-CM | POA: Diagnosis not present

## 2016-07-17 DIAGNOSIS — R6884 Jaw pain: Secondary | ICD-10-CM | POA: Diagnosis present

## 2016-07-17 DIAGNOSIS — J45909 Unspecified asthma, uncomplicated: Secondary | ICD-10-CM | POA: Diagnosis not present

## 2016-07-17 DIAGNOSIS — Z79899 Other long term (current) drug therapy: Secondary | ICD-10-CM | POA: Insufficient documentation

## 2016-07-17 MED ORDER — PENICILLIN V POTASSIUM 500 MG PO TABS: 500.0000 mg | ORAL_TABLET | Freq: Four times a day (QID) | ORAL | 0 refills | Status: AC

## 2016-07-17 NOTE — ED Notes (Signed)
Left upper and lower tooth pain and she cannot afford dentisrt

## 2016-07-17 NOTE — Discharge Instructions (Signed)
Please take your medications as prescribed. Take all of your antibiotic, do not save or share them. Continue taking ibuprofen, 600 mg every 6 hours as needed. Follow-up with your dentist or use the attached resources to help find a dentist. Return to ED for new or worsening symptoms.  [facility]  [facility]  [address]  [place], [place] [id number]  Phone [phone]  [facility] [employer] [facility] [place] exemplifies the Dental [employer]?s vision to improve the health and quality of life of all [place] by Public house managercreating leaders with a passion to care for the underserved and by leading the nation in community-based, service learning oral health education. We are committed to offering comprehensive general dental services for adults, children and special needs patients in a safe, caring and professional setting.  Appointments: Our clinic is open Monday through Friday 8:00 a.m. until 5:00 p.m. The amount of time scheduled for an appointment depends on the patient?s specific needs. We ask that you keep your appointed time for care or provide 24-hour notice of all appointment changes. Parents or legal guardians must accompany minor children.  Payment for Services: Medicaid and other insurance plans are welcome. Payment for services is due when services are rendered and may be made by cash or credit card. If you have dental insurance, we will assist you with your claim submission.   Emergencies: Emergency services will be provided Monday through Friday on a walk-in basis. Please arrive early for emergency services. After hours emergency services will be provided for patients of record as required.  Services:  Comprehensive General Dentistry  Children?s Dentistry  Oral Surgery - Extractions  Root Canals  Sealants and Tooth Colored  Fillings  Crowns and HaematologistBridges  Dentures and Partial Dentures  Implant Services  Periodontal Services and Cleanings  Cosmetic Tooth Whitening  Digital Radiography  3-D/[facility]

## 2016-07-17 NOTE — ED Provider Notes (Signed)
[facility]-EMERGENCY DEPT Provider Note   CSN: [id number] Arrival date & time: 07/17/16  1252   By signing my name below, I, Teofilo PodMatthew P. Jamison, attest that this documentation has been prepared under the direction and in the presence of Joycie PeekBenjamin Jeffie Widdowson, PA-C. Electronically Signed: Teofilo PodMatthew P. Jamison, ED Scribe. 07/17/2016. 1:23 PM.   History   Chief Complaint Chief Complaint  Patient presents with  . Jaw Pain    The history is provided by the patient. No language interpreter was used.   HPI Comments:  Wanda Garrett is a 21 y.o. female who presents to the Emergency Department complaining of constant left-sided jaw pain x 3 days. Pt states that she may have bitten the inside of her left cheek. She states that the pain is worse with eating/drinking, and notes that her mouth feels swollen. Minimal relief with ibuprofen. Pt denies other associated symptoms. No fevers, chills, ear pain, drooling.  Past Medical History:  Diagnosis Date  . Anemia   . Asthma   . Bipolar 2 disorder (HCC)   . Depression   . Generalized anxiety disorder   . PTSD (post-traumatic stress disorder)     There are no active problems to display for this patient.   History reviewed. No pertinent surgical history.  OB History    No data available       Home Medications    Prior to Admission medications   Medication Sig Start Date End Date Taking? Authorizing Provider  cyclobenzaprine (FLEXERIL) 10 MG tablet Take 1 tablet (10 mg total) by mouth 2 (two) times daily as needed for muscle spasms. 08/30/15   Arthor CaptainAbigail Harris, PA-C  naproxen (NAPROSYN) 500 MG tablet Take 1 tablet (500 mg total) by mouth 2 (two) times daily with a meal. 08/30/15   Arthor CaptainAbigail Harris, PA-C  Norgestimate-Ethinyl Estradiol Triphasic (ORTHO TRI-CYCLEN, 28,) 0.18/0.215/0.25 MG-35 MCG tablet Take 1 tablet by mouth daily. 08/11/15   Marily MemosJason Mesner, MD  ondansetron (ZOFRAN) 4 MG tablet Take 1 tablet (4 mg total) by mouth every 6 (six) hours. 08/11/15    Marily MemosJason Mesner, MD  penicillin v potassium (VEETID) 500 MG tablet Take 1 tablet (500 mg total) by mouth 4 (four) times daily. 07/17/16 07/24/16  Joycie PeekBenjamin Tesla Keeler, PA-C    Family History No family history on file.  Social History Social History  Substance Use Topics  . Smoking status: Former Smoker    Types: Cigarettes    Quit date: 05/19/2016  . Smokeless tobacco: Never Used  . Alcohol use No     Allergies   Patient has no known allergies.   Review of Systems Review of Systems 10 Systems reviewed and are negative for acute change except as noted in the HPI.    Physical Exam Updated Vital Signs BP 146/98 (BP Location: Right Arm)   Pulse 81   Temp 97.7 F (36.5 C) (Oral)   Resp 18   Ht 5\' 5"  (1.651 m)   Wt 122.5 kg   LMP 06/08/2016   SpO2 100%   BMI 44.93 kg/m   Physical Exam  Constitutional: She appears well-developed and well-nourished. No distress.  HENT:  Head: Normocephalic and atraumatic.  Discomfort located to left mandibular molars and in submandibular space along the left angle of mandible. Overall appropriate dentition.  Mucous membranes are moist. No unilateral tonsillar swelling, uvula midline, no glossal swelling or elevation. No trismus. No fluctuance or evidence of a drainable abscess. No other evidence of emergent infection, Retropharyngeal or Peritonsillar abscess, Ludwig or Vincents angina. Tolerating  secretions well. Patent airway    Eyes: Conjunctivae and EOM are normal.  Neck: Normal range of motion. Neck supple.  Cardiovascular: Normal rate.   Pulmonary/Chest: Effort normal. No respiratory distress.  Abdominal: Soft. She exhibits no distension.  Musculoskeletal: Normal range of motion.  Neurological: She is alert.  Skin: Skin is warm and dry. She is not diaphoretic.  Psychiatric: She has a normal mood and affect.  Nursing note and vitals reviewed.    ED Treatments / Results  DIAGNOSTIC STUDIES:  Oxygen Saturation is 100% on RA, normal by  my interpretation.    COORDINATION OF CARE:  1:22 PM Discussed treatment plan with pt at bedside and pt agreed to plan.   Labs (all labs ordered are listed, but only abnormal results are displayed) Labs Reviewed - No data to display  EKG  EKG Interpretation None       Radiology No results found.  Procedures Procedures (including critical care time)  Medications Ordered in ED Medications - No data to display   Initial Impression / Assessment and Plan / ED Course  Patient with dentalgia.  No abscess requiring immediate incision and drainage.  Exam not concerning for Ludwig's angina or pharyngeal abscess.  Will treat with Pen VK, NSAIDs. Pt instructed to follow-up with dentist-outpatient resources given.  Discussed return precautions. Pt safe for discharge.  I have reviewed the triage vital signs and the nursing notes.  Pertinent labs & imaging results that were available during my care of the patient were reviewed by me and considered in my medical decision making (see chart for details).       Final Clinical Impressions(s) / ED Diagnoses   Final diagnoses:  Pain, dental    New Prescriptions New Prescriptions   PENICILLIN V POTASSIUM (VEETID) 500 MG TABLET    Take 1 tablet (500 mg total) by mouth 4 (four) times daily.   I personally performed the services described in this documentation, which was scribed in my presence. The recorded information has been reviewed and is accurate.     Joycie Peek, PA-C 07/17/16 1403    Mancel Bale, MD 07/17/16 2045

## 2016-07-17 NOTE — ED Triage Notes (Signed)
Pt states jaw pain since Friday.  Pain increases with eating.

## 2016-09-04 ENCOUNTER — Encounter (HOSPITAL_COMMUNITY): Payer: Self-pay | Admitting: Emergency Medicine

## 2016-09-04 ENCOUNTER — Ambulatory Visit (HOSPITAL_COMMUNITY)
Admission: EM | Admit: 2016-09-04 | Discharge: 2016-09-04 | Disposition: A | Discharge status: Home / Self Care | Payer: BLUE CROSS/BLUE SHIELD | Attending: Internal Medicine | Admitting: Internal Medicine

## 2016-09-04 DIAGNOSIS — J301 Allergic rhinitis due to pollen: Secondary | ICD-10-CM | POA: Diagnosis not present

## 2016-09-04 MED ORDER — PREDNISONE 50 MG PO TABS: ORAL_TABLET | ORAL | 0 refills | Status: DC

## 2016-09-04 MED ORDER — ALBUTEROL SULFATE HFA 108 (90 BASE) MCG/ACT IN AERS: 2.0000 | INHALATION_SPRAY | RESPIRATORY_TRACT | 0 refills | Status: DC | PRN

## 2016-09-04 NOTE — ED Provider Notes (Signed)
CSN: [id number]     Arrival date & time 09/04/16  1714 History   First MD Initiated Contact with Patient 09/04/16 1846     Chief Complaint  Patient presents with  . Fever   (Consider location/radiation/quality/duration/timing/severity/associated sxs/prior Treatment) 21 year old female complaining of fever for 1 week, fever subjective and not measured. Also complaining of PND, sore throat, weakness, fatigue. She is also had runny nose and congestion. No history of asthma or smoking.      Past Medical History:  Diagnosis Date  . Anemia   . Asthma   . Bipolar 2 disorder (HCC)   . Depression   . Generalized anxiety disorder   . PTSD (post-traumatic stress disorder)    History reviewed. No pertinent surgical history. History reviewed. No pertinent family history. Social History  Substance Use Topics  . Smoking status: Former Smoker    Types: Cigarettes    Quit date: 05/19/2016  . Smokeless tobacco: Never Used  . Alcohol use No   OB History    No data available     Review of Systems  Constitutional: Positive for fever.  HENT: Positive for congestion, postnasal drip, rhinorrhea and sore throat.   Eyes: Negative.   Respiratory: Positive for shortness of breath and wheezing.   Gastrointestinal: Negative.   Neurological: Negative.   All other systems reviewed and are negative.   Allergies  Patient has no known allergies.  Home Medications   Prior to Admission medications   Medication Sig Start Date End Date Taking? Authorizing Provider  BuPROPion HCl (WELLBUTRIN PO) Take by mouth.   Yes Historical Provider, MD  busPIRone (BUSPAR) 10 MG tablet Take 10 mg by mouth 3 (three) times daily.   Yes Historical Provider, MD  Norgestimate-Ethinyl Estradiol Triphasic (ORTHO TRI-CYCLEN, 28,) 0.18/0.215/0.25 MG-35 MCG tablet Take 1 tablet by mouth daily. 08/11/15  Yes Marily Memos, MD  albuterol (PROVENTIL HFA;VENTOLIN HFA) 108 (90 Base) MCG/ACT inhaler Inhale 2 puffs into the lungs  every 4 (four) hours as needed for wheezing or shortness of breath. 09/04/16   Hayden Rasmussen, NP  predniSONE (DELTASONE) 50 MG tablet 1 tab po daily for 6 days. Take with food. 09/04/16   Hayden Rasmussen, NP   Meds Ordered and Administered this Visit  Medications - No data to display  BP 117/73 (BP Location: Right Wrist)   Pulse 83   Temp 98.2 F (36.8 C) (Oral)   Resp 20   SpO2 98%  No data found.   Physical Exam  Constitutional: She is oriented to person, place, and time. She appears well-developed and well-nourished. No distress.  HENT:  Mouth/Throat: No oropharyngeal exudate.  Bilateral TMs mildly retracted otherwise normal. Oropharynx with light erythema, cobblestoning and clear PND.  Eyes: EOM are normal.  Neck: Normal range of motion. Neck supple.  Cardiovascular: Normal rate, regular rhythm, normal heart sounds and intact distal pulses.   Pulmonary/Chest: Effort normal.  Tidal volume and deep inspiration or clear. Having the patient cough reveals some distant coarseness.  Musculoskeletal: Normal range of motion. She exhibits no edema.  Lymphadenopathy:    She has no cervical adenopathy.  Neurological: She is alert and oriented to person, place, and time.  Skin: Skin is warm and dry.  Psychiatric: She has a normal mood and affect. Her behavior is normal.  Nursing note and vitals reviewed.   Urgent Care Course     Procedures (including critical care time)  Labs Review Labs Reviewed - No data to display  Imaging Review No results  found.   Visual Acuity Review  Right Eye Distance:   Left Eye Distance:   Bilateral Distance:    Right Eye Near:   Left Eye Near:    Bilateral Near:         MDM   1. Seasonal allergic rhinitis due to pollen    Sudafed PE 10 mg every 4 to 6 hours as needed for congestion Allegra or Zyrtec daily as needed for drainage and runny nose. For stronger antihistamine may take Chlor-Trimeton 2 to 4 mg every 4 to 6 hours, may cause  drowsiness. Saline nasal spray used frequently. Ibuprofen 600 mg every 6 hours as needed for pain, discomfort or fever. Drink plenty of fluids and stay well-hydrated. Flonase or Rhinocort nasal spray daily Meds ordered this encounter  Medications  . BuPROPion HCl (WELLBUTRIN PO)    Sig: Take by mouth.  . busPIRone (BUSPAR) 10 MG tablet    Sig: Take 10 mg by mouth 3 (three) times daily.  Marland Kitchen albuterol (PROVENTIL HFA;VENTOLIN HFA) 108 (90 Base) MCG/ACT inhaler    Sig: Inhale 2 puffs into the lungs every 4 (four) hours as needed for wheezing or shortness of breath.    Dispense:  1 Inhaler    Refill:  0    Order Specific Question:   Supervising Provider    Answer:   Eustace Moore [[id number]]  . predniSONE (DELTASONE) 50 MG tablet    Sig: 1 tab po daily for 6 days. Take with food.    Dispense:  6 tablet    Refill:  0    Order Specific Question:   Supervising Provider    Answer:   Eustace Moore [[id number]]       Hayden Rasmussen, NP 09/04/16 1907

## 2016-09-04 NOTE — ED Triage Notes (Signed)
The patient presented to the [facility] with a complaint of a cough and sore throat x 1 week. The patient denied any recorded fever at home.

## 2016-09-04 NOTE — Discharge Instructions (Signed)
Sudafed PE 10 mg every 4 to 6 hours as needed for congestion °Allegra or Zyrtec daily as needed for drainage and runny nose. °For stronger antihistamine may take Chlor-Trimeton 2 to 4 mg every 4 to 6 hours, may cause drowsiness. °Saline nasal spray used frequently. °Ibuprofen 600 mg every 6 hours as needed for pain, discomfort or fever. °Drink plenty of fluids and stay well-hydrated. °Flonase or Rhinocort nasal spray daily °

## 2017-03-28 ENCOUNTER — Encounter (HOSPITAL_COMMUNITY): Payer: Self-pay | Admitting: Neurology

## 2017-03-28 ENCOUNTER — Emergency Department (HOSPITAL_COMMUNITY)
Admission: EM | Admit: 2017-03-28 | Discharge: 2017-03-28 | Disposition: A | Discharge status: Home / Self Care | Payer: BLUE CROSS/BLUE SHIELD | Attending: Emergency Medicine | Admitting: Emergency Medicine

## 2017-03-28 DIAGNOSIS — Z87891 Personal history of nicotine dependence: Secondary | ICD-10-CM | POA: Insufficient documentation

## 2017-03-28 DIAGNOSIS — J45909 Unspecified asthma, uncomplicated: Secondary | ICD-10-CM | POA: Insufficient documentation

## 2017-03-28 DIAGNOSIS — N921 Excessive and frequent menstruation with irregular cycle: Secondary | ICD-10-CM | POA: Insufficient documentation

## 2017-03-28 DIAGNOSIS — Z79899 Other long term (current) drug therapy: Secondary | ICD-10-CM | POA: Insufficient documentation

## 2017-03-28 DIAGNOSIS — R102 Pelvic and perineal pain: Secondary | ICD-10-CM | POA: Insufficient documentation

## 2017-03-28 LAB — CBC
HEMATOCRIT: 34.5 % — AB (ref 36.0–46.0)
Hemoglobin: 10.6 g/dL — ABNORMAL LOW (ref 12.0–15.0)
MCH: 21.4 pg — ABNORMAL LOW (ref 26.0–34.0)
MCHC: 30.7 g/dL (ref 30.0–36.0)
MCV: 69.6 fL — ABNORMAL LOW (ref 78.0–100.0)
Platelets: 375 10*3/uL (ref 150–400)
RBC: 4.96 MIL/uL (ref 3.87–5.11)
RDW: 16.6 % — AB (ref 11.5–15.5)
WBC: 8.7 10*3/uL (ref 4.0–10.5)

## 2017-03-28 LAB — WET PREP, GENITAL
Clue Cells Wet Prep HPF POC: NONE SEEN
Sperm: NONE SEEN
Trich, Wet Prep: NONE SEEN
WBC, Wet Prep HPF POC: NONE SEEN
Yeast Wet Prep HPF POC: NONE SEEN

## 2017-03-28 LAB — URINALYSIS, ROUTINE W REFLEX MICROSCOPIC
BILIRUBIN URINE: NEGATIVE
Glucose, UA: NEGATIVE mg/dL
Ketones, ur: NEGATIVE mg/dL
Leukocytes, UA: NEGATIVE
Nitrite: NEGATIVE
PROTEIN: NEGATIVE mg/dL
Specific Gravity, Urine: >1.03 — ABNORMAL HIGH (ref 1.005–1.030)
pH: 5.5 (ref 5.0–8.0)

## 2017-03-28 LAB — URINALYSIS, MICROSCOPIC (REFLEX)

## 2017-03-28 LAB — I-STAT BETA HCG BLOOD, ED (MC, WL, AP ONLY): I-stat hCG, quantitative: <5 m[IU]/mL (ref <5)

## 2017-03-28 MED ORDER — FERROUS SULFATE 325 (65 FE) MG PO TABS: 325.0000 mg | ORAL_TABLET | Freq: Every day | ORAL | 0 refills | Status: DC

## 2017-03-28 MED ORDER — KETOROLAC TROMETHAMINE 30 MG/ML IJ SOLN
30.0000 mg | Freq: Once | INTRAMUSCULAR | Status: AC
  Administered 2017-03-28: 30 mg via INTRAMUSCULAR
  Filled 2017-03-28: qty 1

## 2017-03-28 NOTE — ED Triage Notes (Signed)
Pt reports on her period x 1 week, last night started having left sided pelvic period. She then noticed she was passing clots. Uses a menstrual cup, and is filling it up every hour. Last month had a copper IUD removed, started on oral contraception. Is not having any pain anymore.

## 2017-03-28 NOTE — Discharge Instructions (Signed)
Please read and follow all provided instructions.  Your diagnoses today include:  1. Menorrhagia with irregular cycle     Tests performed today include: Vital signs. See below for your results today.   Medications prescribed:  Take as prescribed   Home care instructions:  Follow any educational materials contained in this packet.  Follow-up instructions: Please follow-up with OBGYN for further evaluation of symptoms and treatment   Return instructions:  Please return to the Emergency Department if you do not get better, if you get worse, or new symptoms OR  - Fever (temperature greater than 101.49F)  - Bleeding that does not stop with holding pressure to the area    -Severe pain (please note that you may be more sore the day after your accident)  - Chest Pain  - Difficulty breathing  - Severe nausea or vomiting  - Inability to tolerate food and liquids  - Passing out  - Skin becoming red around your wounds  - Change in mental status (confusion or lethargy)  - New numbness or weakness    Please return if you have any other emergent concerns.  Additional Information:  Your vital signs today were: BP (!) 152/96 (BP Location: Right Arm)    Pulse 83    Temp 97.8 F (36.6 C) (Oral)    Resp 16    Ht 5\' 5"  (1.651 m)    Wt 117.9 kg (260 lb)    LMP 03/21/2017    SpO2 100%    BMI 43.27 kg/m  If your blood pressure (BP) was elevated above 135/85 this visit, please have this repeated by your doctor within one month. ---------------

## 2017-03-28 NOTE — ED Notes (Signed)
Patient understands discharge. Signature pad not working  

## 2017-03-28 NOTE — ED Provider Notes (Signed)
[facility]   CSN: [id number] Arrival date & time: 03/28/17  1323     History   Chief Complaint Chief Complaint  Patient presents with  . Vaginal Bleeding    HPI Wanda Galasheresa Lansberry is a 21 y.o. female.  HPI  21 y.o. female with a hx of Anemia, Asthma, PTSD, presents to the Emergency Department today due to menstrual cycle x 1 week. Pt notes left sided pelvic discomfort last night with passage of clots. Rates pain 8/10. This has since subsided. Now rates pain 3/10. Describes as a rubber band in left pelvic region. Pt with menstrual cup in place and states that she is filling it up every hour. Pt states that last month she had a copper IUD removed as was then started on OCPs. Pt sexually active with one partner. No N/V. No diarrhea. No CP/SOB. No other symptoms noted   Past Medical History:  Diagnosis Date  . Anemia   . Asthma   . Bipolar 2 disorder (HCC)   . Depression   . Generalized anxiety disorder   . PTSD (post-traumatic stress disorder)     There are no active problems to display for this patient.   History reviewed. No pertinent surgical history.  OB History    No data available       Home Medications    Prior to Admission medications   Medication Sig Start Date End Date Taking? Authorizing Provider  albuterol (PROVENTIL HFA;VENTOLIN HFA) 108 (90 Base) MCG/ACT inhaler Inhale 2 puffs into the lungs every 4 (four) hours as needed for wheezing or shortness of breath. 09/04/16   Hayden RasmussenMabe, David, NP  BuPROPion HCl (WELLBUTRIN PO) Take by mouth.    [provider]  busPIRone (BUSPAR) 10 MG tablet Take 10 mg by mouth 3 (three) times daily.    [provider]  Norgestimate-Ethinyl Estradiol Triphasic (ORTHO TRI-CYCLEN, 28,) 0.18/0.215/0.25 MG-35 MCG tablet Take 1 tablet by mouth daily. 08/11/15   Mesner, Barbara CowerJason, MD  predniSONE (DELTASONE) 50 MG tablet 1 tab po daily for 6 days. Take with food. 09/04/16   Hayden RasmussenMabe,  David, NP    Family History No family history on file.  Social History Social History   Tobacco Use  . Smoking status: Former Smoker    Types: Cigarettes    Last attempt to quit: 05/19/2016    Years since quitting: 0.8  . Smokeless tobacco: Never Used  Substance Use Topics  . Alcohol use: No  . Drug use: No     Allergies   Patient has no known allergies.   Review of Systems Review of Systems ROS reviewed and all are negative for acute change except as noted in the HPI  Physical Exam Updated Vital Signs BP (!) 152/96 (BP Location: Right Arm)   Pulse 83   Temp 97.8 F (36.6 C) (Oral)   Resp 16   Ht 5\' 5"  (1.651 m)   Wt 117.9 kg (260 lb)   LMP 03/21/2017   SpO2 100%   BMI 43.27 kg/m   Physical Exam  Constitutional: She is oriented to person, place, and time. She appears well-developed and well-nourished. No distress.  NAD  HENT:  Head: Normocephalic and atraumatic.  Right Ear: Tympanic membrane, external ear and ear canal normal.  Left Ear: Tympanic membrane, external ear and ear canal normal.  Nose: Nose normal.  Mouth/Throat: Uvula is midline, oropharynx is clear and moist and mucous membranes are normal. No trismus in the jaw. No  oropharyngeal exudate, posterior oropharyngeal erythema or tonsillar abscesses.  Eyes: EOM are normal. Pupils are equal, round, and reactive to light.  Neck: Normal range of motion. Neck supple. No tracheal deviation present.  Cardiovascular: Normal rate, regular rhythm, S1 normal, S2 normal, normal heart sounds, intact distal pulses and normal pulses.  Pulmonary/Chest: Effort normal and breath sounds normal. No respiratory distress. She has no decreased breath sounds. She has no wheezes. She has no rhonchi. She has no rales.  Abdominal: Soft. Bowel sounds are normal. There is no tenderness. There is no rigidity, no rebound, no guarding, no CVA tenderness, no tenderness at McBurney's point and negative Murphy's sign.  Musculoskeletal:  Normal range of motion.  Neurological: She is alert and oriented to person, place, and time.  Skin: Skin is warm and dry.  Psychiatric: She has a normal mood and affect. Her speech is normal and behavior is normal. Thought content normal.  Nursing note and vitals reviewed.  Exam performed by Eston Estersyler M Leonte Horrigan,  exam chaperoned Date: 03/28/2017 Pelvic exam: normal external genitalia without evidence of trauma. VULVA: normal appearing vulva with no masses, tenderness or lesion. VAGINA: normal appearing vagina with normal color and discharge, no lesions. CERVIX: normal appearing cervix without lesions, cervical motion tenderness absent, cervical os closed with out purulent discharge; vaginal discharge - bloody, Wet prep and DNA probe for chlamydia and GC obtained.   ADNEXA: normal adnexa in size, nontender and no masses UTERUS: uterus is normal size, shape, consistency and nontender.   ED Treatments / Results  Labs (all labs ordered are listed, but only abnormal results are displayed) Labs Reviewed  CBC - Abnormal; Notable for the following components:      Result Value   Hemoglobin 10.6 (*)    HCT 34.5 (*)    MCV 69.6 (*)    MCH 21.4 (*)    RDW 16.6 (*)    All other components within normal limits  URINALYSIS, ROUTINE W REFLEX MICROSCOPIC - Abnormal; Notable for the following components:   Color, Urine BROWN (*)    APPearance TURBID (*)    Specific Gravity, Urine >1.030 (*)    Hgb urine dipstick LARGE (*)    All other components within normal limits  URINALYSIS, MICROSCOPIC (REFLEX) - Abnormal; Notable for the following components:   Bacteria, UA MANY (*)    Squamous Epithelial / LPF 0-5 (*)    All other components within normal limits  WET PREP, GENITAL  I-STAT BETA HCG BLOOD, ED ([facility], [facility], [facility] ONLY)  GC/CHLAMYDIA PROBE AMP ([place]) NOT [facility]    EKG  EKG Interpretation None       Radiology No results found.  Procedures Procedures (including critical care  time)  Medications Ordered in ED Medications  ketorolac (TORADOL) 30 MG/ML injection 30 mg (30 mg Intramuscular Given 03/28/17 1509)     Initial Impression / Assessment and Plan / ED Course  I have reviewed the triage vital signs and the nursing notes.  Pertinent labs & imaging results that were available during my care of the patient were reviewed by me and considered in my medical decision making (see chart for details).  Final Clinical Impressions(s) / ED Diagnoses  {I have reviewed and evaluated the relevant laboratory values.   {I have reviewed the relevant previous healthcare records.  {I obtained HPI from historian.   ED Course:  Assessment: Pt is a 21 y.o. female with a hx of Anemia, Asthma, PTSD, presents to the Emergency Department today due  to menstrual cycle x 1 week. Pt notes left sided pelvic discomfort last night with passage of clots. Rates pain 8/10. This has since subsided. Now rates pain 3/10. Describes as a rubber band in left pelvic region. Pt with menstrual cup in place and states that she is filling it up every hour. Pt states that last month she had a copper IUD removed as was then started on OCPs. Pt sexually active with one partner. No N/V. No diarrhea. No CP/SOB. On exam, pt in NAD. Nontoxic/nonseptic appearing. VSS. Afebrile. Lungs CTA. Heart RRR. Abdomen nontender soft. GU Exam with blood noted from cervix. No Adnexal. No CMT. Wet prep unremarkable. GC obtained. UA unremarkable. CBC stable. Given analgesia in ED. Plan is to DC home with close follow up to OBGYN. At time of discharge, Patient is in no acute distress. Vital Signs are stable. Patient is able to ambulate. Patient able to tolerate PO.   Disposition/Plan:  DC Home Additional Verbal discharge instructions given and discussed with patient.  Pt Instructed to f/u with PCP in the next week for evaluation and treatment of symptoms. Return precautions given Pt acknowledges and agrees with plan  Supervising  Physician Shaune Pollack, MD  Final diagnoses:  Menorrhagia with irregular cycle    ED Discharge Orders    None       Audry Pili, PA-C 03/28/17 1649    Shaune Pollack, MD 03/28/17 2237

## 2017-03-28 NOTE — ED Notes (Signed)
Pelvic cart set up 

## 2017-03-30 LAB — GC/CHLAMYDIA PROBE AMP (~~LOC~~) NOT AT ARMC
Chlamydia: NEGATIVE
Neisseria Gonorrhea: NEGATIVE

## 2017-08-31 ENCOUNTER — Other Ambulatory Visit: Payer: Self-pay

## 2017-08-31 ENCOUNTER — Emergency Department (HOSPITAL_COMMUNITY)
Admission: EM | Admit: 2017-08-31 | Discharge: 2017-08-31 | Disposition: A | Discharge status: Home / Self Care | Payer: Managed Care, Other (non HMO) | Attending: Emergency Medicine | Admitting: Emergency Medicine

## 2017-08-31 ENCOUNTER — Encounter (HOSPITAL_COMMUNITY): Payer: Self-pay

## 2017-08-31 DIAGNOSIS — R197 Diarrhea, unspecified: Secondary | ICD-10-CM | POA: Diagnosis not present

## 2017-08-31 DIAGNOSIS — J45909 Unspecified asthma, uncomplicated: Secondary | ICD-10-CM | POA: Diagnosis not present

## 2017-08-31 DIAGNOSIS — R109 Unspecified abdominal pain: Secondary | ICD-10-CM | POA: Insufficient documentation

## 2017-08-31 DIAGNOSIS — R110 Nausea: Secondary | ICD-10-CM | POA: Insufficient documentation

## 2017-08-31 DIAGNOSIS — Z79899 Other long term (current) drug therapy: Secondary | ICD-10-CM | POA: Insufficient documentation

## 2017-08-31 DIAGNOSIS — Z87891 Personal history of nicotine dependence: Secondary | ICD-10-CM | POA: Diagnosis not present

## 2017-08-31 LAB — COMPREHENSIVE METABOLIC PANEL
ALBUMIN: 3.8 g/dL (ref 3.5–5.0)
ALT: 20 U/L (ref 14–54)
AST: 20 U/L (ref 15–41)
Alkaline Phosphatase: 86 U/L (ref 38–126)
Anion gap: 12 (ref 5–15)
BUN: 7 mg/dL (ref 6–20)
CO2: 19 mmol/L — AB (ref 22–32)
CREATININE: 0.56 mg/dL (ref 0.44–1.00)
Calcium: 9.1 mg/dL (ref 8.9–10.3)
Chloride: 109 mmol/L (ref 101–111)
GFR calc non Af Amer: >60 mL/min (ref >60)
GLUCOSE: 97 mg/dL (ref 65–99)
Potassium: 3.8 mmol/L (ref 3.5–5.1)
SODIUM: 140 mmol/L (ref 135–145)
Total Bilirubin: 0.4 mg/dL (ref 0.3–1.2)
Total Protein: 6.9 g/dL (ref 6.5–8.1)

## 2017-08-31 LAB — HCG, QUANTITATIVE, PREGNANCY: hCG, Beta Chain, Quant, S: 1 m[IU]/mL (ref <5)

## 2017-08-31 LAB — CBC
HCT: 34.4 % — ABNORMAL LOW (ref 36.0–46.0)
Hemoglobin: 10.7 g/dL — ABNORMAL LOW (ref 12.0–15.0)
MCH: 21.9 pg — AB (ref 26.0–34.0)
MCHC: 31.1 g/dL (ref 30.0–36.0)
MCV: 70.3 fL — AB (ref 78.0–100.0)
PLATELETS: 356 10*3/uL (ref 150–400)
RBC: 4.89 MIL/uL (ref 3.87–5.11)
RDW: 18.5 % — ABNORMAL HIGH (ref 11.5–15.5)
WBC: 11.2 10*3/uL — ABNORMAL HIGH (ref 4.0–10.5)

## 2017-08-31 LAB — URINALYSIS, ROUTINE W REFLEX MICROSCOPIC
Bilirubin Urine: NEGATIVE
GLUCOSE, UA: NEGATIVE mg/dL
HGB URINE DIPSTICK: NEGATIVE
Ketones, ur: 5 mg/dL — AB
Leukocytes, UA: NEGATIVE
Nitrite: NEGATIVE
PROTEIN: NEGATIVE mg/dL
Specific Gravity, Urine: 1.02 (ref 1.005–1.030)
pH: 5 (ref 5.0–8.0)

## 2017-08-31 LAB — I-STAT BETA HCG BLOOD, ED (MC, WL, AP ONLY): I-stat hCG, quantitative: 5.5 m[IU]/mL — ABNORMAL HIGH (ref <5)

## 2017-08-31 LAB — LIPASE, BLOOD: LIPASE: 23 U/L (ref 11–51)

## 2017-08-31 MED ORDER — CIPROFLOXACIN HCL 500 MG PO TABS: 500.0000 mg | ORAL_TABLET | Freq: Two times a day (BID) | ORAL | 0 refills | Status: DC

## 2017-08-31 NOTE — ED Provider Notes (Signed)
[facility] EMERGENCY DEPARTMENT Provider Note   CSN: [id number] Arrival date & time: 08/31/17  4098     History   Chief Complaint No chief complaint on file.   HPI Wanda Garrett is a 23 y.o. female.  The history is provided by the patient and medical records. No language interpreter was used.   Wanda Garrett is a 22 y.o. female  with a PMH of asthma, bipolar disorder, anxiety who presents to the Emergency Department complaining of loose, watery non-bloody stool x 9 days. Associated with intermittent abdominal cramping and nausea. No vomiting.  She was seen 2 to 3 days symptoms by telemetry medicine who recommended seeing urgent care.  A day or 2 later, she went to a fast med who told her to wait it out a little longer.  Recommended Pepto-Bismol and Imodium which she has been taking as directed with no improvement.  Her symptoms have been persistent without any improvement, therefore her husband recommended that she come to the emergency department for further evaluation.  She denies any fever or chills.  No back pain, chest pain, shortness of breath.  Denies any history of similar.  No recent travel, camping, well water, etc.  No on else with similar symptoms.  She and her husband eat the majority of her meals together and he also has new symptoms.    Past Medical History:  Diagnosis Date  . Anemia   . Asthma   . Bipolar 2 disorder (HCC)   . Depression   . Generalized anxiety disorder   . PTSD (post-traumatic stress disorder)     There are no active problems to display for this patient.   History reviewed. No pertinent surgical history.   OB History   None      Home Medications    Prior to Admission medications   Medication Sig Start Date End Date Taking? Authorizing Provider  albuterol (PROVENTIL HFA;VENTOLIN HFA) 108 (90 Base) MCG/ACT inhaler Inhale 2 puffs into the lungs every 4 (four) hours as needed for wheezing or shortness of breath. 09/04/16    Hayden Rasmussen, NP  BuPROPion HCl (WELLBUTRIN PO) Take by mouth.    [provider]  busPIRone (BUSPAR) 10 MG tablet Take 10 mg by mouth 3 (three) times daily.    [provider]  ciprofloxacin (CIPRO) 500 MG tablet Take 1 tablet (500 mg total) by mouth every 12 (twelve) hours. 08/31/17   Davante Gerke, Chase Picket, PA-C  ferrous sulfate 325 (65 FE) MG tablet Take 1 tablet (325 mg total) by mouth daily. 03/28/17   Audry Pili, PA-C  Norgestimate-Ethinyl Estradiol Triphasic (ORTHO TRI-CYCLEN, 28,) 0.18/0.215/0.25 MG-35 MCG tablet Take 1 tablet by mouth daily. 08/11/15   Mesner, Barbara Cower, MD  predniSONE (DELTASONE) 50 MG tablet 1 tab po daily for 6 days. Take with food. 09/04/16   Hayden Rasmussen, NP    Family History No family history on file.  Social History Social History   Tobacco Use  . Smoking status: Former Smoker    Types: Cigarettes    Last attempt to quit: 05/19/2016    Years since quitting: 1.2  . Smokeless tobacco: Never Used  Substance Use Topics  . Alcohol use: No  . Drug use: No     Allergies   Patient has no known allergies.   Review of Systems Review of Systems  Gastrointestinal: Positive for abdominal pain, diarrhea and nausea. Negative for blood in stool, constipation, rectal pain and vomiting.  All other systems reviewed and  are negative.    Physical Exam Updated Vital Signs BP 136/75 (BP Location: Right Arm)   Pulse 76   Temp 98.3 F (36.8 C) (Oral)   Resp 16   Ht 5\' 5"  (1.651 m)   Wt 107.5 kg (237 lb)   SpO2 98%   BMI 39.44 kg/m   Physical Exam  Constitutional: She is oriented to person, place, and time. She appears well-developed and well-nourished. No distress.  Nontoxic-appearing.  HENT:  Head: Normocephalic and atraumatic.  Neck: Neck supple.  Cardiovascular: Normal rate, regular rhythm and normal heart sounds.  No murmur heard. Pulmonary/Chest: Effort normal and breath sounds normal. No respiratory distress.  Abdominal: Soft. Bowel  sounds are normal. She exhibits no distension.  Generalized abdominal tenderness without focality.  Negative Murphy's.  No focal tenderness McBurney's.  Negative psoas and obturator.  No CVA tenderness.  Neurological: She is alert and oriented to person, place, and time.  Skin: Skin is warm and dry.  Nursing note and vitals reviewed.    ED Treatments / Results  Labs (all labs ordered are listed, but only abnormal results are displayed) Labs Reviewed  COMPREHENSIVE METABOLIC PANEL - Abnormal; Notable for the following components:      Result Value   CO2 19 (*)    All other components within normal limits  CBC - Abnormal; Notable for the following components:   WBC 11.2 (*)    Hemoglobin 10.7 (*)    HCT 34.4 (*)    MCV 70.3 (*)    MCH 21.9 (*)    RDW 18.5 (*)    All other components within normal limits  URINALYSIS, ROUTINE W REFLEX MICROSCOPIC - Abnormal; Notable for the following components:   APPearance HAZY (*)    Ketones, ur 5 (*)    All other components within normal limits  I-STAT BETA HCG BLOOD, ED ([facility], [facility], [facility] ONLY) - Abnormal; Notable for the following components:   I-stat hCG, quantitative 5.5 (*)    All other components within normal limits  GASTROINTESTINAL PANEL BY PCR, STOOL (REPLACES STOOL CULTURE)  C DIFFICILE QUICK SCREEN W PCR REFLEX  LIPASE, BLOOD  HCG, QUANTITATIVE, PREGNANCY    EKG None  Radiology No results found.  Procedures Procedures (including critical care time)  Medications Ordered in ED Medications - No data to display   Initial Impression / Assessment and Plan / ED Course  I have reviewed the triage vital signs and the nursing notes.  Pertinent labs & imaging results that were available during my care of the patient were reviewed by me and considered in my medical decision making (see chart for details).    Wanda Garrett is a 22 y.o. female who presents to ED for nausea and non-bloody, watery diarrhea for 9 days. No vomiting. On  exam, patient is afebrile, non-toxic appearing with a non-surgical abdominal exam. Labs reviewed: mild nonspecific leukocytosis of 11.2. Anemia with H&H of 10.7/34.4. UA without signs of infection. No blood in UA either. Initial istat hcg mildly elevated at 5.5 - likely lab error. Official hcg quant negative at 1. Lipase 23. Doubt pancreatitis. Offered IV fluids, however patient reported nurses had difficulty with lab draw and she does not want IV fluids at this time. She is tolerating PO in ED. Recommended stool sample. Patient waited 30 minutes to give stool sample, but was unable. She does not want to wait for testing to be done. Evaluation does not show pathology that would require ongoing emergent intervention or inpatient treatment.  Given prolonged length of symptoms, will treat. Rx for Cipro given. PCP follow up encouraged. Spoke at length with patient about signs or symptoms that should prompt return to emergency Department including inability to tolerate PO, blood in the stools, fevers, focal localization of abdominal pain, new/worsening symptoms or any additional concerns. Patient understands diagnosis and plan of care as dictated above. All questions answered.  Patient discussed with Dr. Rosalia Hammers who agrees with treatment plan.    Final Clinical Impressions(s) / ED Diagnoses   Final diagnoses:  Acute diarrhea    ED Discharge Orders        Ordered    ciprofloxacin (CIPRO) 500 MG tablet  Every 12 hours     08/31/17 1548       Kahli Fitzgerald, Chase Picket, PA-C 08/31/17 1558    Margarita Grizzle, MD 09/01/17 [phone]

## 2017-08-31 NOTE — Discharge Instructions (Signed)
It was my pleasure taking care of you today!   Please take all of your antibiotics until finished!   Buy regular (not diet) Gatorade and mix half water / half Gatorade. Take small sips throughout the day of this to help with hydration.   Follow up with your primary care doctor.   Return to ER for fever, vomiting, new or worsening symptoms, any additional concerns.

## 2017-08-31 NOTE — ED Notes (Signed)
Spoke with MD regarding no one signing up for this pt yet. Aware of pt.

## 2017-08-31 NOTE — ED Notes (Signed)
Pt attempted to give stool sample, was unable. Pt states she is ready to be discharged. Ward, PA aware, and at the bedside with pt.

## 2017-08-31 NOTE — ED Triage Notes (Signed)
Patient complains of abdominal cramping and bloating x 1 week with ongoing diarrhea. Taking otc med with no relief. No fever, states that the pain radiates to right flank

## 2017-11-16 ENCOUNTER — Other Ambulatory Visit: Payer: Self-pay

## 2017-11-16 ENCOUNTER — Encounter: Payer: Self-pay | Admitting: Physician Assistant

## 2017-11-16 ENCOUNTER — Ambulatory Visit (INDEPENDENT_AMBULATORY_CARE_PROVIDER_SITE_OTHER): Payer: Managed Care, Other (non HMO) | Admitting: Physician Assistant

## 2017-11-16 VITALS — BP 106/70 | HR 76 | Temp 98.5°F | Resp 16 | Ht 65.0 in | Wt 236.4 lb

## 2017-11-16 DIAGNOSIS — E669 Obesity, unspecified: Secondary | ICD-10-CM | POA: Diagnosis not present

## 2017-11-16 DIAGNOSIS — Z1329 Encounter for screening for other suspected endocrine disorder: Secondary | ICD-10-CM

## 2017-11-16 DIAGNOSIS — Z13228 Encounter for screening for other metabolic disorders: Secondary | ICD-10-CM

## 2017-11-16 DIAGNOSIS — G8929 Other chronic pain: Secondary | ICD-10-CM | POA: Diagnosis not present

## 2017-11-16 DIAGNOSIS — E66812 Obesity, class 2: Secondary | ICD-10-CM

## 2017-11-16 DIAGNOSIS — Z6839 Body mass index (BMI) 39.0-39.9, adult: Secondary | ICD-10-CM | POA: Diagnosis not present

## 2017-11-16 DIAGNOSIS — Z130 Encounter for screening for diseases of the blood and blood-forming organs and certain disorders involving the immune mechanism: Secondary | ICD-10-CM | POA: Diagnosis not present

## 2017-11-16 LAB — POCT URINALYSIS DIP (MANUAL ENTRY)
Bilirubin, UA: NEGATIVE
Blood, UA: NEGATIVE
Glucose, UA: NEGATIVE mg/dL
Ketones, POC UA: NEGATIVE mg/dL
Leukocytes, UA: NEGATIVE
Nitrite, UA: NEGATIVE
Protein Ur, POC: NEGATIVE mg/dL
Spec Grav, UA: 1.02 (ref 1.010–1.025)
Urobilinogen, UA: 0.2 E.U./dL
pH, UA: 5.5 (ref 5.0–8.0)

## 2017-11-16 NOTE — Progress Notes (Signed)
Wanda Garrett  MRN: [id number] DOB: 14-Jun-1995  PCP: Patient, No Pcp Per  Subjective:  Pt is a 22 year old female PMH anemia, anxiety, asthma and depression who presents to clinic for chronic pain and fatigue x 2 years. She is a new patient here. She has never had a primary care provider.  Works from home for [employer].   In March 2 years ago "one day it was like every muscle in my body felt like lead" "I spent a whole summer in bed". Pain eventually dulled, but is still there "feels like dull pain". larger muscles are the worse. Massage therapist helps some. Exercise helps her push through it more.  Since working out and dieting lost 40lbs and noticed a reduction of flares.  Gets occasional "attack or flare. Anything that touches me feels like sand paper". Stress can trigger a flare.   She takes otc iron supplement, Sprinted, Wellbutrin and buspar.    FHx: Mother MS and sarcoidosis; Mat aunt lupus; sister has fibromyalgia.   Endorses bleeding x 3 days after intercourse.   Former Smoker (Quit: 05/19/2016) Drinks < 3 drinks/weeks Review of Systems  There are no active problems to display for this patient.   Current Outpatient Medications on File Prior to Visit  Medication Sig Dispense Refill  . BuPROPion HCl (WELLBUTRIN PO) Take by mouth.    . busPIRone (BUSPAR) 10 MG tablet Take 10 mg by mouth 3 (three) times daily.    . norgestimate-ethinyl estradiol (SPRINTEC 28) 0.25-35 MG-MCG tablet Take 1 tablet by mouth daily.    Marland Kitchen albuterol (PROVENTIL HFA;VENTOLIN HFA) 108 (90 Base) MCG/ACT inhaler Inhale 2 puffs into the lungs every 4 (four) hours as needed for wheezing or shortness of breath. (Patient not taking: Reported on 11/16/2017) 1 Inhaler 0  . ferrous sulfate 325 (65 FE) MG tablet Take 1 tablet (325 mg total) by mouth daily. (Patient not taking: Reported on 11/16/2017) 30 tablet 0   No current facility-administered medications on file prior to visit.     No Known  Allergies   Objective:  BP 106/70 (BP Location: Left Arm, Patient Position: Sitting, Cuff Size: Large)   Pulse 76   Temp 98.5 F (36.9 C) (Oral)   Resp 16   Ht 5' 5" (1.651 m)   Wt 236 lb 6.4 oz (107.2 kg)   LMP 10/25/2017   SpO2 97%   BMI 39.34 kg/m   Physical Exam  Constitutional: She is oriented to person, place, and time. No distress.  Obese  HENT:  Mouth/Throat: Oropharynx is clear and moist. Abnormal dentition. Dental caries present. No dental abscesses.  Cardiovascular: Normal rate, regular rhythm and normal heart sounds.  Musculoskeletal:       Right shoulder: She exhibits tenderness (point tenderness scattered diffusely posterior torso. ). She exhibits normal range of motion and no bony tenderness.  Neurological: She is alert and oriented to person, place, and time.  Skin: Skin is warm and dry. No rash noted.  Psychiatric: Judgment normal.  Vitals reviewed.  Results for orders placed or performed in visit on 11/16/17  POCT urinalysis dipstick  Result Value Ref Range   Color, UA yellow yellow   Clarity, UA hazy (A) clear   Glucose, UA negative negative mg/dL   Bilirubin, UA negative negative   Ketones, POC UA negative negative mg/dL   Spec Grav, UA 1.020 1.010 - 1.025   Blood, UA negative negative   pH, UA 5.5 5.0 - 8.0   Protein Ur,  POC negative negative mg/dL   Urobilinogen, UA 0.2 0.2 or 1.0 E.U./dL   Nitrite, UA Negative Negative   Leukocytes, UA Negative Negative    Assessment and Plan :  1. Other chronic pain 2. Screening for endocrine, metabolic and immunity disorder 3. Class 2 obesity with body mass index (BMI) of 39.0 to 39.9 in adult, unspecified obesity type, unspecified whether serious comorbidity present - Pt presents c/o diffuse pain x 2 years. FHx includes lupus, fibromyalgia, MS and sarcoidosis. Screening labs are pending. Will consider rheumatology referral.  - Lipid panel - CBC with Differential/Platelet - C-reactive protein -  Sedimentation Rate - CMP14+EGFR - ANA w/Reflex if Positive - Rheumatoid factor - POCT urinalysis dipstick - VITAMIN D 25 Hydroxy (Vit-D Deficiency, Fractures) - Vitamin B12 - TSH - Testosterone - Hemoglobin A1c  Mercer Pod, PA-C  [facility] [place] 11/16/2017 2:37 PM

## 2017-11-16 NOTE — Patient Instructions (Addendum)
We will contact you with the results of your lab work. I may refer you to rheumatology if needed.   Call your insurance and see which dentist is covered. Please make an appointment for evaluation.   Make an appointment with [facility].  [phone] [address], [place], [place] [address]  Health Maintenance, Female Adopting a healthy lifestyle and getting preventive care can go a long way to promote health and wellness. Talk with your health care provider about what schedule of regular examinations is right for you. This is a good chance for you to check in with your provider about disease prevention and staying healthy. In between checkups, there are plenty of things you can do on your own. Experts have done a lot of research about which lifestyle changes and preventive measures are most likely to keep you healthy. Ask your health care provider for more information. Weight and diet Eat a healthy diet  Be sure to include plenty of vegetables, fruits, low-fat dairy products, and lean protein.  Do not eat a lot of foods high in solid fats, added sugars, or salt.  Get regular exercise. This is one of the most important things you can do for your health. ? Most adults should exercise for at least 150 minutes each week. The exercise should increase your heart rate and make you sweat (moderate-intensity exercise). ? Most adults should also do strengthening exercises at least twice a week. This is in addition to the moderate-intensity exercise.  Maintain a healthy weight  Body mass index (BMI) is a measurement that can be used to identify possible weight problems. It estimates body fat based on height and weight. Your health care provider can help determine your BMI and help you achieve or maintain a healthy weight.  For females 30 years of age and older: ? A BMI below 18.5 is considered underweight. ? A BMI of 18.5 to 24.9 is normal. ? A BMI of 25 to 29.9 is  considered overweight. ? A BMI of 30 and above is considered obese.  Watch levels of cholesterol and blood lipids  You should start having your blood tested for lipids and cholesterol at 23 years of age, then have this test every 5 years.  You may need to have your cholesterol levels checked more often if: ? Your lipid or cholesterol levels are high. ? You are older than 23 years of age. ? You are at high risk for heart disease.  Cancer screening Lung Cancer  Lung cancer screening is recommended for adults 20-87 years old who are at high risk for lung cancer because of a history of smoking.  A yearly low-dose CT scan of the lungs is recommended for people who: ? Currently smoke. ? Have quit within the past 15 years. ? Have at least a 30-pack-year history of smoking. A pack year is smoking an average of one pack of cigarettes a day for 1 year.  Yearly screening should continue until it has been 15 years since you quit.  Yearly screening should stop if you develop a health problem that would prevent you from having lung cancer treatment.  Breast Cancer  Practice breast self-awareness. This means understanding how your breasts normally appear and feel.  It also means doing regular breast self-exams. Let your health care provider know about any changes, no matter how small.  If you are in your 20s or 30s, you should have a clinical breast exam (CBE) by a health care provider  every 1-3 years as part of a regular health exam.  If you are 9 or older, have a CBE every year. Also consider having a breast X-ray (mammogram) every year.  If you have a family history of breast cancer, talk to your health care provider about genetic screening.  If you are at high risk for breast cancer, talk to your health care provider about having an MRI and a mammogram every year.  Breast cancer gene (BRCA) assessment is recommended for women who have family members with BRCA-related cancers.  BRCA-related cancers include: ? Breast. ? Ovarian. ? Tubal. ? Peritoneal cancers.  Results of the assessment will determine the need for genetic counseling and BRCA1 and BRCA2 testing.  Cervical Cancer Your health care provider may recommend that you be screened regularly for cancer of the pelvic organs (ovaries, uterus, and vagina). This screening involves a pelvic examination, including checking for microscopic changes to the surface of your cervix (Pap test). You may be encouraged to have this screening done every 3 years, beginning at age 13.  For women ages 34-65, health care providers may recommend pelvic exams and Pap testing every 3 years, or they may recommend the Pap and pelvic exam, combined with testing for human papilloma virus (HPV), every 5 years. Some types of HPV increase your risk of cervical cancer. Testing for HPV may also be done on women of any age with unclear Pap test results.  Other health care providers may not recommend any screening for nonpregnant women who are considered low risk for pelvic cancer and who do not have symptoms. Ask your health care provider if a screening pelvic exam is right for you.  If you have had past treatment for cervical cancer or a condition that could lead to cancer, you need Pap tests and screening for cancer for at least 20 years after your treatment. If Pap tests have been discontinued, your risk factors (such as having a new sexual partner) need to be reassessed to determine if screening should resume. Some women have medical problems that increase the chance of getting cervical cancer. In these cases, your health care provider may recommend more frequent screening and Pap tests.  Colorectal Cancer  This type of cancer can be detected and often prevented.  Routine colorectal cancer screening usually begins at 23 years of age and continues through 23 years of age.  Your health care provider may recommend screening at an earlier age if  you have risk factors for colon cancer.  Your health care provider may also recommend using home test kits to check for hidden blood in the stool.  A small camera at the end of a tube can be used to examine your colon directly (sigmoidoscopy or colonoscopy). This is done to check for the earliest forms of colorectal cancer.  Routine screening usually begins at age 34.  Direct examination of the colon should be repeated every 5-10 years through 23 years of age. However, you may need to be screened more often if early forms of precancerous polyps or small growths are found.  Skin Cancer  Check your skin from head to toe regularly.  Tell your health care provider about any new moles or changes in moles, especially if there is a change in a mole's shape or color.  Also tell your health care provider if you have a mole that is larger than the size of a pencil eraser.  Always use sunscreen. Apply sunscreen liberally and repeatedly throughout the day.  Protect yourself by wearing long sleeves, pants, a wide-brimmed hat, and sunglasses whenever you are outside.  Heart disease, diabetes, and high blood pressure  High blood pressure causes heart disease and increases the risk of stroke. High blood pressure is more likely to develop in: ? People who have blood pressure in the high end of the normal range (130-139/85-89 mm Hg). ? People who are overweight or obese. ? People who are African American.  If you are 59-59 years of age, have your blood pressure checked every 3-5 years. If you are 12 years of age or older, have your blood pressure checked every year. You should have your blood pressure measured twice-once when you are at a hospital or clinic, and once when you are not at a hospital or clinic. Record the average of the two measurements. To check your blood pressure when you are not at a hospital or clinic, you can use: ? An automated blood pressure machine at a pharmacy. ? A home blood  pressure monitor.  If you are between 32 years and 28 years old, ask your health care provider if you should take aspirin to prevent strokes.  Have regular diabetes screenings. This involves taking a blood sample to check your fasting blood sugar level. ? If you are at a normal weight and have a low risk for diabetes, have this test once every three years after 23 years of age. ? If you are overweight and have a high risk for diabetes, consider being tested at a younger age or more often. Preventing infection Hepatitis B  If you have a higher risk for hepatitis B, you should be screened for this virus. You are considered at high risk for hepatitis B if: ? You were born in a country where hepatitis B is common. Ask your health care provider which countries are considered high risk. ? Your parents were born in a high-risk country, and you have not been immunized against hepatitis B (hepatitis B vaccine). ? You have HIV or AIDS. ? You use needles to inject street drugs. ? You live with someone who has hepatitis B. ? You have had sex with someone who has hepatitis B. ? You get hemodialysis treatment. ? You take certain medicines for conditions, including cancer, organ transplantation, and autoimmune conditions.  Hepatitis C  Blood testing is recommended for: ? Everyone born from [birth year] through 1966. ? Anyone with known risk factors for hepatitis C.  Sexually transmitted infections (STIs)  You should be screened for sexually transmitted infections (STIs) including gonorrhea and chlamydia if: ? You are sexually active and are younger than 23 years of age. ? You are older than 23 years of age and your health care provider tells you that you are at risk for this type of infection. ? Your sexual activity has changed since you were last screened and you are at an increased risk for chlamydia or gonorrhea. Ask your health care provider if you are at risk.  If you do not have HIV, but are at risk,  it may be recommended that you take a prescription medicine daily to prevent HIV infection. This is called pre-exposure prophylaxis (PrEP). You are considered at risk if: ? You are sexually active and do not regularly use condoms or know the HIV status of your partner(s). ? You take drugs by injection. ? You are sexually active with a partner who has HIV.  Talk with your health care provider about whether you are at high risk of  being infected with HIV. If you choose to begin PrEP, you should first be tested for HIV. You should then be tested every 3 months for as long as you are taking PrEP. Pregnancy  If you are premenopausal and you may become pregnant, ask your health care provider about preconception counseling.  If you may become pregnant, take 400 to 800 micrograms (mcg) of folic acid every day.  If you want to prevent pregnancy, talk to your health care provider about birth control (contraception). Osteoporosis and menopause  Osteoporosis is a disease in which the bones lose minerals and strength with aging. This can result in serious bone fractures. Your risk for osteoporosis can be identified using a bone density scan.  If you are 65 years of age or older, or if you are at risk for osteoporosis and fractures, ask your health care provider if you should be screened.  Ask your health care provider whether you should take a calcium or vitamin D supplement to lower your risk for osteoporosis.  Menopause may have certain physical symptoms and risks.  Hormone replacement therapy may reduce some of these symptoms and risks. Talk to your health care provider about whether hormone replacement therapy is right for you. Follow these instructions at home:  Schedule regular health, dental, and eye exams.  Stay current with your immunizations.  Do not use any tobacco products including cigarettes, chewing tobacco, or electronic cigarettes.  If you are pregnant, do not drink  alcohol.  If you are breastfeeding, limit how much and how often you drink alcohol.  Limit alcohol intake to no more than 1 drink per day for nonpregnant women. One drink equals 12 ounces of beer, 5 ounces of wine, or 1 ounces of hard liquor.  Do not use street drugs.  Do not share needles.  Ask your health care provider for help if you need support or information about quitting drugs.  Tell your health care provider if you often feel depressed.  Tell your health care provider if you have ever been abused or do not feel safe at home. This information is not intended to replace advice given to you by your health care provider. Make sure you discuss any questions you have with your health care provider. Document Released: 11/07/2010 Document Revised: 09/30/2015 Document Reviewed: 01/26/2015 Elsevier Interactive Patient Education  [employer].  Thank you for coming in today. I hope you feel we met your needs. Feel free to call PCP if you have any questions or further requests. Please consider signing up for MyChart if you do not already have it, as this is a great way to communicate with me.  Best,  Whitney McVey, PA-C  IF you received an x-ray today, you will receive an invoice from [facility]. Please contact [facility] at [phone] with questions or concerns regarding your invoice.   IF you received labwork today, you will receive an invoice from [facility]. Please contact [facility] at [phone] with questions or concerns regarding your invoice.   Our billing staff will not be able to assist you with questions regarding bills from these companies.  You will be contacted with the lab results as soon as they are available. The fastest way to get your results is to activate your My Chart account. Instructions are located on the last page of this paperwork. If you have not heard from [place] regarding the results in 2 weeks, please contact this office.

## 2017-11-17 LAB — CMP14+EGFR
ALT: 12 IU/L (ref 0–32)
AST: 12 IU/L (ref 0–40)
Albumin/Globulin Ratio: 1.5 (ref 1.2–2.2)
Albumin: 4.4 g/dL (ref 3.5–5.5)
Alkaline Phosphatase: 88 IU/L (ref 39–117)
BUN/Creatinine Ratio: 17 (ref 9–23)
BUN: 12 mg/dL (ref 6–20)
Bilirubin Total: 0.3 mg/dL (ref 0.0–1.2)
CO2: 22 mmol/L (ref 20–29)
Calcium: 9.8 mg/dL (ref 8.7–10.2)
Chloride: 104 mmol/L (ref 96–106)
Creatinine, Ser: 0.72 mg/dL (ref 0.57–1.00)
GFR calc Af Amer: 137 mL/min/{1.73_m2} (ref >59)
GFR calc non Af Amer: 118 mL/min/{1.73_m2} (ref >59)
Globulin, Total: 2.9 g/dL (ref 1.5–4.5)
Glucose: 92 mg/dL (ref 65–99)
Potassium: 4.6 mmol/L (ref 3.5–5.2)
Sodium: 140 mmol/L (ref 134–144)
Total Protein: 7.3 g/dL (ref 6.0–8.5)

## 2017-11-17 LAB — CBC WITH DIFFERENTIAL/PLATELET
Basophils Absolute: 0 10*3/uL (ref 0.0–0.2)
Basos: 0 %
EOS (ABSOLUTE): 0.5 10*3/uL — ABNORMAL HIGH (ref 0.0–0.4)
Eos: 5 %
Hematocrit: 32.5 % — ABNORMAL LOW (ref 34.0–46.6)
Hemoglobin: 10 g/dL — ABNORMAL LOW (ref 11.1–15.9)
Immature Grans (Abs): 0 10*3/uL (ref 0.0–0.1)
Immature Granulocytes: 0 %
Lymphocytes Absolute: 1.8 10*3/uL (ref 0.7–3.1)
Lymphs: 19 %
MCH: 22.1 pg — ABNORMAL LOW (ref 26.6–33.0)
MCHC: 30.8 g/dL — ABNORMAL LOW (ref 31.5–35.7)
MCV: 72 fL — ABNORMAL LOW (ref 79–97)
Monocytes Absolute: 0.4 10*3/uL (ref 0.1–0.9)
Monocytes: 5 %
Neutrophils Absolute: 7 10*3/uL (ref 1.4–7.0)
Neutrophils: 71 %
Platelets: 425 10*3/uL (ref 150–450)
RBC: 4.53 x10E6/uL (ref 3.77–5.28)
RDW: 16.4 % — ABNORMAL HIGH (ref 12.3–15.4)
WBC: 9.8 10*3/uL (ref 3.4–10.8)

## 2017-11-17 LAB — LIPID PANEL
Chol/HDL Ratio: 2.4 ratio (ref 0.0–4.4)
Cholesterol, Total: 154 mg/dL (ref 100–199)
HDL: 65 mg/dL (ref >39)
LDL Calculated: 64 mg/dL (ref 0–99)
Triglycerides: 125 mg/dL (ref 0–149)
VLDL Cholesterol Cal: 25 mg/dL (ref 5–40)

## 2017-11-17 LAB — SEDIMENTATION RATE: Sed Rate: 49 mm/h — ABNORMAL HIGH (ref 0–32)

## 2017-11-17 LAB — C-REACTIVE PROTEIN: CRP: 41 mg/L — ABNORMAL HIGH (ref 0–10)

## 2017-11-17 LAB — ANA W/REFLEX IF POSITIVE: Anti Nuclear Antibody(ANA): NEGATIVE

## 2017-11-17 LAB — HEMOGLOBIN A1C
Est. average glucose Bld gHb Est-mCnc: 114 mg/dL
Hgb A1c MFr Bld: 5.6 % (ref 4.8–5.6)

## 2017-11-17 LAB — TSH: TSH: 1.66 u[IU]/mL (ref 0.450–4.500)

## 2017-11-17 LAB — TESTOSTERONE: Testosterone: 9 ng/dL (ref 8–48)

## 2017-11-17 LAB — VITAMIN D 25 HYDROXY (VIT D DEFICIENCY, FRACTURES): Vit D, 25-Hydroxy: 16.5 ng/mL — ABNORMAL LOW (ref 30.0–100.0)

## 2017-11-17 LAB — RHEUMATOID FACTOR: Rheumatoid fact SerPl-aCnc: 12.7 [IU]/mL (ref 0.0–13.9)

## 2017-11-17 LAB — VITAMIN B12: Vitamin B-12: 313 pg/mL (ref 232–1245)

## 2017-11-20 ENCOUNTER — Other Ambulatory Visit: Payer: Self-pay | Admitting: Physician Assistant

## 2017-11-20 DIAGNOSIS — Z130 Encounter for screening for diseases of the blood and blood-forming organs and certain disorders involving the immune mechanism: Secondary | ICD-10-CM

## 2017-11-20 DIAGNOSIS — R7982 Elevated C-reactive protein (CRP): Secondary | ICD-10-CM

## 2017-11-20 NOTE — Progress Notes (Signed)
Elevated CRP and sed rate, neg ANA and RF. Plan to refer to rheumatology for evaluation.  Anemia - pt to rtc for lab only iron level check.  Start Vitamin D supplement and calcium. RTC in 3-4 months for recheck.  Labs and plan released to mychart.

## 2017-11-20 NOTE — Progress Notes (Signed)
Refer to rheumatology for elevated CK and Sed rate.

## 2017-11-23 NOTE — Progress Notes (Signed)
Office Visit Note  Patient: Wanda Garrett             Date of Birth: 03/31/1996           MRN: [id number]             PCP: Dorise Hiss, PA-C Referring: Desiree Lucy* Visit Date: 12/07/2017 Occupation: Customer service representative  Subjective:  Generalized pain.   History of Present Illness: Wanda Garrett is a 22 y.o. female in consultation per request of the PCP for evaluation of generalized myalgias.  According to patient her symptoms started about 2 to 3 years ago.  She states she woke up with the pain all over and difficulty moving.  She describes the pain was in her back, shoulders and her lower extremities.  She states that summer she has been mostly in bed and has difficulty been dressing up.  She states the symptoms gradually dissipated but never did back go back to normal.  She tried dietary modifications and is stretching which only helps some.  She states increased stress causes flare in her symptoms.  She states with the flares she also notices that she has hyperalgesia and a skin sensitivity.  She finds her scalp very sensitive during the flares.  She had intentional weight loss of 50 pounds this year.  She has been also working out which is been helpful.  She had no insurance so far.  She established with a PCP now referred her here after initial evaluation.  Activities of Daily Living:  Patient reports morning stiffness for 20-30 minutes.   Patient Reports nocturnal pain.  Difficulty dressing/grooming: Denies Difficulty climbing stairs: Denies Difficulty getting out of chair: Denies Difficulty using hands for taps, buttons, cutlery, and/or writing: Denies  Review of Systems  Constitutional: Positive for fatigue. Negative for night sweats, weight gain and weight loss.  HENT: Negative for mouth sores, trouble swallowing, trouble swallowing, mouth dryness and nose dryness.   Eyes: Positive for dryness. Negative for pain, redness and visual disturbance.   Respiratory: Negative for cough, shortness of breath and difficulty breathing.   Cardiovascular: Negative for chest pain, palpitations, hypertension, irregular heartbeat and swelling in legs/feet.  Gastrointestinal: Negative for blood in stool, constipation and diarrhea.  Endocrine: Negative for increased urination.  Genitourinary: Negative for vaginal dryness.  Musculoskeletal: Positive for myalgias, morning stiffness and myalgias. Negative for arthralgias, joint pain, joint swelling, muscle weakness and muscle tenderness.  Skin: Positive for hair loss. Negative for color change, rash, skin tightness, ulcers and sensitivity to sunlight.  Allergic/Immunologic: Negative for susceptible to infections.  Neurological: Negative for dizziness, numbness, headaches, memory loss, night sweats and weakness.  Hematological: Negative for swollen glands.  Psychiatric/Behavioral: Positive for depressed mood and sleep disturbance. The patient is not nervous/anxious.     PMFS History:  Patient Active Problem List   Diagnosis Date Noted  . PTSD (post-traumatic stress disorder) 12/07/2017  . Anxiety and depression 12/07/2017  . History of anemia 12/07/2017  . History of asthma 12/07/2017  . Other fatigue 12/07/2017  . Class 2 obesity with body mass index (BMI) of 39.0 to 39.9 in adult 11/16/2017  . Other chronic pain 11/16/2017    Past Medical History:  Diagnosis Date  . Anemia   . Asthma   . Bipolar 2 disorder ([place])   . Depression   . Generalized anxiety disorder   . PTSD (post-traumatic stress disorder)     Family History  Problem Relation Age of Onset  . Hyperlipidemia Father   .  Hypertension Father   . Cancer Maternal Grandmother        Uterine  . Diabetes Maternal Grandmother   . Cancer Paternal Grandmother        Lung  . Heart disease Paternal Grandfather   . Hyperlipidemia Paternal Grandfather   . Hypertension Paternal Grandfather    History reviewed. No pertinent surgical  history. Social History   Social History Narrative  . Not on file    Objective: Vital Signs: BP 115/72 (BP Location: Left Arm, Patient Position: Sitting, Cuff Size: Normal)   Pulse 75   Ht 5' 5"  (1.651 m)   Wt 241 lb (109.3 kg)   BMI 40.10 kg/m    Physical Exam  Constitutional: She is oriented to person, place, and time. She appears well-developed and well-nourished.  HENT:  Head: Normocephalic and atraumatic.  Eyes: Conjunctivae and EOM are normal.  Neck: Normal range of motion.  Cardiovascular: Normal rate, regular rhythm, normal heart sounds and intact distal pulses.  Pulmonary/Chest: Effort normal and breath sounds normal.  Abdominal: Soft. Bowel sounds are normal.  Lymphadenopathy:    She has no cervical adenopathy.  Neurological: She is alert and oriented to person, place, and time.  Skin: Skin is warm and dry. Capillary refill takes less than 2 seconds.  Psychiatric: She has a normal mood and affect. Her behavior is normal.  Nursing note and vitals reviewed.    Musculoskeletal Exam: C-spine thoracic lumbar spine good range of motion.  Shoulder joints elbow joints wrist joint MCPs PIPs DIPs were in good range of motion with no synovitis.  Hip joints knee joints ankles MTPs PIPs DIPs were in good range of motion.  She had generalized hyperalgesia and positive tender points on examination.  CDAI Exam: No CDAI exam completed.   Investigation: Findings:  11/16/2017: CRP 41, sed rate 49, ANA negative, RF 12.7, vitamin D 1 6.5, vitamin B12 313, TSH 1.660  Component     Latest Ref Rng & Units 11/16/2017  CRP     0 - 10 mg/L 41 (H)  Sed Rate     0 - 32 mm/hr 49 (H)   Component     Latest Ref Rng & Units 11/16/2017  Hemoglobin A1C     4.8 - 5.6 % 5.6  Est. average glucose Bld gHb Est-mCnc     mg/dL 114  Anti Nuclear Antibody(ANA)     Negative Negative  RA Latex Turbid.     0.0 - 13.9 IU/mL 12.7  Vitamin D, 25-Hydroxy     30.0 - 100.0 ng/mL 16.5 (L)  Vitamin B12      232 - 1,245 pg/mL 313  TSH     0.450 - 4.500 uIU/mL 1.660  Testosterone     8 - 48 ng/dL 9   Imaging: No results found.  Recent Labs: Lab Results  Component Value Date   WBC 9.8 11/16/2017   HGB 10.0 (L) 11/16/2017   PLT 425 11/16/2017   NA 140 11/16/2017   K 4.6 11/16/2017   CL 104 11/16/2017   CO2 22 11/16/2017   GLUCOSE 92 11/16/2017   BUN 12 11/16/2017   CREATININE 0.72 11/16/2017   BILITOT 0.3 11/16/2017   ALKPHOS 88 11/16/2017   AST 12 11/16/2017   ALT 12 11/16/2017   PROT 7.3 11/16/2017   ALBUMIN 4.4 11/16/2017   CALCIUM 9.8 11/16/2017   GFRAA 137 11/16/2017    Speciality Comments: No specialty comments available.  Procedures:  No procedures performed Allergies: Patient has no  known allergies.   Assessment / Plan:     Visit Diagnoses: Elevated ESR-patient has no synovitis on examination.  I believe that elevated ESR could be related to her severe anemia I am uncertain about the true etiology of her anemia.  That could be contributing to fatigue as well.  Myalgia - Chronic fatigue and myalgias.  Massage therapy and exercise provide some pain relief.  Her exam and clinical features are consistent with fibromyalgia syndrome.  Detailed counseling on fibromyalgia syndrome was provided.  She may benefit from Cymbalta.  Have advised her to discuss that further with her psychiatrist.  I would discourage use of Lyrica or Neurontin as it can contribute to weight gain.  She may benefit from Topamax which can be tried for her migraines and that may help her with insomnia and generalized hyperalgesia.  She may discuss that further with her PCP.- Plan: CK, Sedimentation rate.  She would benefit from physical therapy prescription was given.  Stretching exercises and water aerobics were encouraged.  Other fatigue -I believe there are several factors  contributing to fatigue.  She most likely have fibromyalgia syndrome.  She also has chronic insomnia which could be contributing to  fatigue.  She has anemia and vitamin D deficiency.  Plan: Serum protein electrophoresis with reflex, IgG, IgA, IgM  Chronic insomnia-good sleep hygiene was discussed at length.  Vitamin D deficiency-she has severe vitamin D deficiency.  I will call in prescription for vitamin D 50,000 units twice a week for 3 months.  We will recheck vitamin D level in 3 months.  Other chronic pain-she gives history of chronic pain for several years now.  There is positive family history of fibromyalgia  History of asthma  History of anemia-she will need further work-up of anemia through her PCP.  Anxiety and depression-is been followed by psychiatrist.  PTSD (post-traumatic stress disorder)  Family history of systemic lupus erythematosus - Maternal aunt  Family history of fibromyalgia - sister   Orders: Orders Placed This Encounter  Procedures  . CK  . Sedimentation rate  . Serum protein electrophoresis with reflex  . IgG, IgA, IgM   Meds ordered this encounter  Medications  . Vitamin D, Ergocalciferol, (DRISDOL) 50000 units CAPS capsule    Sig: 1 tablet twice a week for 3 months    Dispense:  24 capsule    Refill:  0    Face-to-face time spent with patient was 50  minutes. Greater than 50% of time was spent in counseling and coordination of care.  Follow-Up Instructions: Return for Fibromyalgia, elevated ESR.   Bo Merino, MD  Note - This record has been created using Editor, commissioning.  Chart creation errors have been sought, but may not always  have been located. Such creation errors do not reflect on  the standard of medical care.

## 2017-12-07 ENCOUNTER — Ambulatory Visit (INDEPENDENT_AMBULATORY_CARE_PROVIDER_SITE_OTHER): Payer: Managed Care, Other (non HMO) | Admitting: Rheumatology

## 2017-12-07 ENCOUNTER — Encounter: Payer: Self-pay | Admitting: Rheumatology

## 2017-12-07 VITALS — BP 115/72 | HR 75 | Ht 65.0 in | Wt 241.0 lb

## 2017-12-07 DIAGNOSIS — G4709 Other insomnia: Secondary | ICD-10-CM

## 2017-12-07 DIAGNOSIS — Z8269 Family history of other diseases of the musculoskeletal system and connective tissue: Secondary | ICD-10-CM

## 2017-12-07 DIAGNOSIS — Z862 Personal history of diseases of the blood and blood-forming organs and certain disorders involving the immune mechanism: Secondary | ICD-10-CM | POA: Insufficient documentation

## 2017-12-07 DIAGNOSIS — R5383 Other fatigue: Secondary | ICD-10-CM | POA: Insufficient documentation

## 2017-12-07 DIAGNOSIS — M791 Myalgia, unspecified site: Secondary | ICD-10-CM

## 2017-12-07 DIAGNOSIS — E559 Vitamin D deficiency, unspecified: Secondary | ICD-10-CM

## 2017-12-07 DIAGNOSIS — R7982 Elevated C-reactive protein (CRP): Secondary | ICD-10-CM | POA: Diagnosis not present

## 2017-12-07 DIAGNOSIS — F419 Anxiety disorder, unspecified: Secondary | ICD-10-CM

## 2017-12-07 DIAGNOSIS — F32A Depression, unspecified: Secondary | ICD-10-CM | POA: Insufficient documentation

## 2017-12-07 DIAGNOSIS — G8929 Other chronic pain: Secondary | ICD-10-CM

## 2017-12-07 DIAGNOSIS — F329 Major depressive disorder, single episode, unspecified: Secondary | ICD-10-CM | POA: Insufficient documentation

## 2017-12-07 DIAGNOSIS — F4310 Post-traumatic stress disorder, unspecified: Secondary | ICD-10-CM | POA: Insufficient documentation

## 2017-12-07 DIAGNOSIS — Z8709 Personal history of other diseases of the respiratory system: Secondary | ICD-10-CM | POA: Insufficient documentation

## 2017-12-07 MED ORDER — VITAMIN D (ERGOCALCIFEROL) 1.25 MG (50000 UNIT) PO CAPS: ORAL_CAPSULE | ORAL | 0 refills | Status: DC

## 2017-12-11 LAB — IFE INTERPRETATION: IMMUNOFIX ELECTR INT: NOT DETECTED

## 2017-12-11 LAB — PROTEIN ELECTROPHORESIS, SERUM, WITH REFLEX
Albumin ELP: 3.8 g/dL (ref 3.8–4.8)
Alpha 1: 0.4 g/dL — ABNORMAL HIGH (ref 0.2–0.3)
Alpha 2: 0.9 g/dL (ref 0.5–0.9)
Beta 2: 0.3 g/dL (ref 0.2–0.5)
Beta Globulin: 0.6 g/dL (ref 0.4–0.6)
GAMMA GLOBULIN: 0.9 g/dL (ref 0.8–1.7)
Total Protein: 6.9 g/dL (ref 6.1–8.1)

## 2017-12-11 LAB — IGG, IGA, IGM
IGM, SERUM: 109 mg/dL (ref 50–300)
IMMUNOGLOBULIN A: 221 mg/dL (ref 47–310)
IgG (Immunoglobin G), Serum: 926 mg/dL (ref 600–1640)

## 2017-12-11 LAB — CK: CK TOTAL: 43 U/L (ref 29–143)

## 2017-12-11 LAB — SEDIMENTATION RATE: SED RATE: 29 mm/h — AB (ref 0–20)

## 2017-12-11 NOTE — Progress Notes (Signed)
I will discuss at the fu visit.

## 2017-12-26 DIAGNOSIS — E559 Vitamin D deficiency, unspecified: Secondary | ICD-10-CM | POA: Insufficient documentation

## 2017-12-26 NOTE — Progress Notes (Deleted)
Office Visit Note  Patient: Wanda Garrett             Date of Birth: Sep 19, 1995           MRN: [id number]             PCP: Sebastian AcheMcVey, Elizabeth Whitney, PA-C Referring: Newt MinionMcVey, Elizabeth Whitne* Visit Date: 01/08/2018 Occupation: @GUAROCC @  Subjective:  No chief complaint on file.   History of Present Illness: Wanda Garrett is a 22 y.o. female ***   Activities of Daily Living:  Patient reports morning stiffness for *** {minute/hour:19697}.   Patient {ACTIONS;DENIES/REPORTS:21021675::"Denies"} nocturnal pain.  Difficulty dressing/grooming: {ACTIONS;DENIES/REPORTS:21021675::"Denies"} Difficulty climbing stairs: {ACTIONS;DENIES/REPORTS:21021675::"Denies"} Difficulty getting out of chair: {ACTIONS;DENIES/REPORTS:21021675::"Denies"} Difficulty using hands for taps, buttons, cutlery, and/or writing: {ACTIONS;DENIES/REPORTS:21021675::"Denies"}  No Rheumatology ROS completed.   PMFS History:  Patient Active Problem List   Diagnosis Date Noted  . PTSD (post-traumatic stress disorder) 12/07/2017  . Anxiety and depression 12/07/2017  . History of anemia 12/07/2017  . History of asthma 12/07/2017  . Other fatigue 12/07/2017  . Class 2 obesity with body mass index (BMI) of 39.0 to 39.9 in adult 11/16/2017  . Other chronic pain 11/16/2017    Past Medical History:  Diagnosis Date  . Anemia   . Asthma   . Bipolar 2 disorder (HCC)   . Depression   . Generalized anxiety disorder   . PTSD (post-traumatic stress disorder)     Family History  Problem Relation Age of Onset  . Hyperlipidemia Father   . Hypertension Father   . Cancer Maternal Grandmother        Uterine  . Diabetes Maternal Grandmother   . Cancer Paternal Grandmother        Lung  . Heart disease Paternal Grandfather   . Hyperlipidemia Paternal Grandfather   . Hypertension Paternal Grandfather    No past surgical history on file. Social History   Social History Narrative  . Not on file    Objective: Vital Signs:  There were no vitals taken for this visit.   Physical Exam   Musculoskeletal Exam: ***  CDAI Exam: CDAI Score: Not documented Patient Global Assessment: Not documented; Provider Global Assessment: Not documented Swollen: Not documented; Tender: Not documented Joint Exam   Not documented   There is currently no information documented on the homunculus. Go to the Rheumatology activity and complete the homunculus joint exam.  Investigation: No additional findings.  Imaging: No results found.  Recent Labs: Lab Results  Component Value Date   WBC 9.8 11/16/2017   HGB 10.0 (L) 11/16/2017   PLT 425 11/16/2017   NA 140 11/16/2017   K 4.6 11/16/2017   CL 104 11/16/2017   CO2 22 11/16/2017   GLUCOSE 92 11/16/2017   BUN 12 11/16/2017   CREATININE 0.72 11/16/2017   BILITOT 0.3 11/16/2017   ALKPHOS 88 11/16/2017   AST 12 11/16/2017   ALT 12 11/16/2017   PROT 6.9 12/07/2017   ALBUMIN 4.4 11/16/2017   CALCIUM 9.8 11/16/2017   GFRAA 137 11/16/2017  December 07, 2017 IFE negative, immunoglobulins normal, CK 43, sed rate 29 11/16/2017: CRP 41, sed rate 49, ANA negative, RF 12.7, vitamin D 1 6.5, vitamin B12 313, TSH 1.660 Speciality Comments: No specialty comments available.  Procedures:  No procedures performed Allergies: Patient has no known allergies.   Assessment / Plan:     Visit Diagnoses: No diagnosis found.   Orders: No orders of the defined types were placed in this encounter.  No orders of  the defined types were placed in this encounter.   Face-to-face time spent with patient was *** minutes. Greater than 50% of time was spent in counseling and coordination of care.  Follow-Up Instructions: No follow-ups on file.   Bo Merino, MD  Note - This record has been created using Editor, commissioning.  Chart creation errors have been sought, but may not always  have been located. Such creation errors do not reflect on  the standard of medical care.

## 2018-01-08 ENCOUNTER — Ambulatory Visit: Payer: Managed Care, Other (non HMO) | Admitting: Rheumatology

## 2018-01-23 ENCOUNTER — Ambulatory Visit: Payer: Managed Care, Other (non HMO) | Admitting: Rheumatology

## 2018-01-29 ENCOUNTER — Encounter: Payer: Self-pay | Admitting: Physician Assistant

## 2018-01-29 ENCOUNTER — Ambulatory Visit (INDEPENDENT_AMBULATORY_CARE_PROVIDER_SITE_OTHER): Payer: Managed Care, Other (non HMO) | Admitting: Physician Assistant

## 2018-01-29 VITALS — BP 107/72 | HR 91 | Temp 98.0°F | Resp 17 | Ht 65.0 in | Wt 238.0 lb

## 2018-01-29 DIAGNOSIS — N390 Urinary tract infection, site not specified: Secondary | ICD-10-CM | POA: Diagnosis not present

## 2018-01-29 DIAGNOSIS — R300 Dysuria: Secondary | ICD-10-CM

## 2018-01-29 LAB — POC MICROSCOPIC URINALYSIS (UMFC): Mucus: ABSENT

## 2018-01-29 LAB — POCT URINALYSIS DIP (MANUAL ENTRY)
Bilirubin, UA: NEGATIVE
Blood, UA: NEGATIVE
Glucose, UA: NEGATIVE mg/dL
Ketones, POC UA: NEGATIVE mg/dL
Nitrite, UA: NEGATIVE
Protein Ur, POC: NEGATIVE mg/dL
Spec Grav, UA: 1.02 (ref 1.010–1.025)
Urobilinogen, UA: 0.2 U/dL
pH, UA: 7.5 (ref 5.0–8.0)

## 2018-01-29 MED ORDER — CIPROFLOXACIN HCL 500 MG PO TABS: 500.0000 mg | ORAL_TABLET | Freq: Two times a day (BID) | ORAL | 0 refills | Status: DC

## 2018-01-29 NOTE — Progress Notes (Signed)
Wanda Garrett  MRN: [id number] DOB: 05/07/1996  PCP: Sebastian AcheMcVey, Zaylie Gisler Whitney, PA-C  Subjective:  Pt is a 22 year old female who presents to clinic for dysuria.  [facility]. Symptoms are less intense, however are still present. Lower abdominal and lower back pain started about 5 days ago.   She finished macrobid 4 days ago.  Denies fever, chills, flank pain, hematuria, n/v.   Review of Systems  Constitutional: Negative for chills and fever.  Gastrointestinal: Negative for abdominal pain, nausea and vomiting.  Genitourinary: Positive for dysuria and frequency. Negative for flank pain, hematuria, urgency, vaginal bleeding, vaginal discharge and vaginal pain.    Patient Active Problem List   Diagnosis Date Noted  . Vitamin D deficiency 12/26/2017  . PTSD (post-traumatic stress disorder) 12/07/2017  . Anxiety and depression 12/07/2017  . History of anemia 12/07/2017  . History of asthma 12/07/2017  . Other fatigue 12/07/2017  . Class 2 obesity with body mass index (BMI) of 39.0 to 39.9 in adult 11/16/2017  . Other chronic pain 11/16/2017    Current Outpatient Medications on File Prior to Visit  Medication Sig Dispense Refill  . BuPROPion HCl (WELLBUTRIN PO) Take by mouth.    . busPIRone (BUSPAR) 10 MG tablet Take 10 mg by mouth 3 (three) times daily.    . norgestimate-ethinyl estradiol (SPRINTEC 28) 0.25-35 MG-MCG tablet Take 1 tablet by mouth daily.    . Vitamin D, Ergocalciferol, (DRISDOL) 50000 units CAPS capsule 1 tablet twice a week for 3 months 24 capsule 0   No current facility-administered medications on file prior to visit.     No Known Allergies   Objective:  BP 107/72   Pulse 91   Temp 98 F (36.7 C) (Oral)   Resp 17   Ht 5\' 5"  (1.651 m)   Wt 238 lb (108 kg)   LMP 01/15/2018 (Approximate)   SpO2 98%   BMI 39.61 kg/m   Physical Exam  Constitutional: She is oriented to person, place, and time. No  distress.  Abdominal: Soft. Normal appearance. There is tenderness in the suprapubic area. There is no CVA tenderness.  Neurological: She is alert and oriented to person, place, and time.  Skin: Skin is warm and dry.  Psychiatric: Judgment normal.  Vitals reviewed.  Results for orders placed or performed in visit on 01/29/18  POCT urinalysis dipstick  Result Value Ref Range   Color, UA yellow yellow   Clarity, UA hazy (A) clear   Glucose, UA negative negative mg/dL   Bilirubin, UA negative negative   Ketones, POC UA negative negative mg/dL   Spec Grav, UA [id number] [id number] - 1.025   Blood, UA negative negative   pH, UA 7.5 5.0 - 8.0   Protein Ur, POC negative negative mg/dL   Urobilinogen, UA 0.2 0.2 or 1.0 E.U./dL   Nitrite, UA Negative Negative   Leukocytes, UA Trace (A) Negative  POCT Microscopic Urinalysis ([facility])  Result Value Ref Range   WBC,UR,HPF,POC Few (A) None WBC/hpf   RBC,UR,HPF,POC None None RBC/hpf   Bacteria None None, Too numerous to count   Mucus Absent Absent   Epithelial Cells, UR Per Microscopy Few (A) None, Too numerous to count cells/hpf   Assessment and Plan :  1. Urinary tract infection without hematuria, site unspecified - Pt recently [facility] for [facility]. New onset intermittent lower abdominal and low back pain. Vitals are stable. Plan  to treat for complicated cystitis with cipro 500mg  bid x 6 days. Urine culture is pending. Will contact if antibiotics need adjusting. RTC if no improvement.  - ciprofloxacin (CIPRO) 500 MG tablet; Take 1 tablet (500 mg total) by mouth 2 (two) times daily.  Dispense: 12 tablet; Refill: 0  2. Dysuria - POCT urinalysis dipstick - POCT Microscopic Urinalysis ([facility]) - Urine Culture  Marco Collie, PA-C  Primary Care at [facility] 01/29/2018 12:35 PM  Please note: Portions of this report may have been transcribed using dragon voice recognition software. Every effort was made to  ensure accuracy; however, inadvertent computerized transcription errors may be present.

## 2018-01-29 NOTE — Patient Instructions (Addendum)
  Start taking cipro twice daily for the next 6 days. Continue taking this, even if you feel better sooner  Come back if you are not improving.    Urinary Tract Infection, Adult A urinary tract infection (UTI) is an infection of any part of the urinary tract. The urinary tract includes the:  Kidneys.  Ureters.  Bladder.  Urethra.  These organs make, store, and get rid of pee (urine) in the body. Follow these instructions at home:  Take over-the-counter and prescription medicines only as told by your doctor.  If you were prescribed an antibiotic medicine, take it as told by your doctor. Do not stop taking the antibiotic even if you start to feel better.  Avoid the following drinks: ? Alcohol. ? Caffeine. ? Tea. ? Carbonated drinks.  Drink enough fluid to keep your pee clear or pale yellow.  Keep all follow-up visits as told by your doctor. This is important.  Make sure to: ? Empty your bladder often and completely. Do not to hold pee for long periods of time. ? Empty your bladder before and after sex. ? Wipe from front to back after a bowel movement if you are female. Use each tissue one time when you wipe. Contact a doctor if:  You have back pain.  You have a fever.  You feel sick to your stomach (nauseous).  You throw up (vomit).  Your symptoms do not get better after 3 days.  Your symptoms go away and then come back. Get help right away if:  You have very bad back pain.  You have very bad lower belly (abdominal) pain.  You are throwing up and cannot keep down any medicines or water. This information is not intended to replace advice given to you by your health care provider. Make sure you discuss any questions you have with your health care provider. Document Released: 10/11/2007 Document Revised: 09/30/2015 Document Reviewed: 03/15/2015 Elsevier Interactive Patient Education  [employer].  Thank you for coming in today. I hope you feel we met your  needs.  Feel free to call PCP if you have any questions or further requests.  Please consider signing up for MyChart if you do not already have it, as this is a great way to communicate with me.  Best,  Whitney McVey, PA-C  IF you received an x-ray today, you will receive an invoice from [facility]. Please contact [facility] at [phone] with questions or concerns regarding your invoice.   IF you received labwork today, you will receive an invoice from [place]. Please contact [facility] at [phone] with questions or concerns regarding your invoice.   Our billing staff will not be able to assist you with questions regarding bills from these companies.  You will be contacted with the lab results as soon as they are available. The fastest way to get your results is to activate your My Chart account. Instructions are located on the last page of this paperwork. If you have not heard from [place] regarding the results in 2 weeks, please contact this office.

## 2018-01-30 LAB — URINE CULTURE

## 2018-03-31 ENCOUNTER — Other Ambulatory Visit: Payer: Self-pay | Admitting: Rheumatology

## 2018-04-25 DIAGNOSIS — Z0271 Encounter for disability determination: Secondary | ICD-10-CM

## 2018-04-26 ENCOUNTER — Telehealth: Payer: Self-pay

## 2018-04-26 NOTE — Telephone Encounter (Signed)
We received FMLA paperwork and patient has been advised to follow up with her PCP to fill out. Patient verbalized understanding.

## 2018-04-29 ENCOUNTER — Other Ambulatory Visit: Payer: Self-pay

## 2018-04-29 ENCOUNTER — Encounter: Payer: Self-pay | Admitting: Family Medicine

## 2018-04-29 ENCOUNTER — Ambulatory Visit (INDEPENDENT_AMBULATORY_CARE_PROVIDER_SITE_OTHER): Payer: Managed Care, Other (non HMO) | Admitting: Family Medicine

## 2018-04-29 ENCOUNTER — Telehealth: Payer: Self-pay | Admitting: Physician Assistant

## 2018-04-29 VITALS — BP 104/80 | HR 66 | Temp 98.2°F | Ht 65.0 in | Wt 239.0 lb

## 2018-04-29 DIAGNOSIS — Z23 Encounter for immunization: Secondary | ICD-10-CM | POA: Diagnosis not present

## 2018-04-29 DIAGNOSIS — M797 Fibromyalgia: Secondary | ICD-10-CM | POA: Diagnosis not present

## 2018-04-29 MED ORDER — DULOXETINE HCL 30 MG PO CPEP: 30.0000 mg | ORAL_CAPSULE | Freq: Every day | ORAL | 3 refills | Status: DC

## 2018-04-29 NOTE — Progress Notes (Signed)
12/23/20192:31 PM  Wanda Garrett [birth year], 22 y.o. female [id number]  Chief Complaint  Patient presents with  . Follow-up    FMLA follow up for fibroidmyalgia, needs it to be documented that this is a condition that can be considered for time off request    HPI:   Patient is a 22 y.o. 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 Goes from tolerable and severe during flare ups She is doing yoga and water exercises, massage therapy once a month Brain fog, constant fatigue  Requesting leave during flare up, gets 2-3 a year Sometimes they are prolonged, > 2 weeks Delayed start, currently starts after noon, that works well, would not want her schedule changed Requesting more breaks when needed to be able to stretch and walk around, currently gets 2 breaks and a lunch, she feels that she at least needs 1-2 extra breaks a week  Has never been on medication for fibromyalgia, she is interesting in trying   Fall Risk  04/29/2018 01/29/2018 11/16/2017  Falls in the past year? 0 No No     [facility] 04/29/2018 01/29/2018 11/16/2017  Decreased Interest 0 0 0  Down, Depressed, Hopeless 0 0 0  PHQ - 2 Score 0 0 0    No Known Allergies  Prior to Admission medications   Medication Sig Start Date End Date Taking? Authorizing Provider  BuPROPion HCl (WELLBUTRIN PO) Take by mouth.   Yes [provider]  busPIRone (BUSPAR) 10 MG tablet Take 10 mg by mouth 3 (three) times daily.   Yes [provider]    norgestimate-ethinyl estradiol (SPRINTEC 28) 0.25-35 MG-MCG tablet Take 1 tablet by mouth daily.   Yes [provider]    Past Medical History:  Diagnosis Date  . Anemia   . Asthma   . Depression   . PTSD (post-traumatic stress disorder)     History reviewed. No pertinent surgical history.  Social History   Tobacco Use  . Smoking status: Former Smoker    Types: Cigarettes    Last attempt to quit: 05/19/2016    Years since quitting: 1.9  . Smokeless tobacco: Never Used  Substance Use Topics  . Alcohol use: Yes    Alcohol/week: 3.0 standard drinks    Types: 3 Standard drinks or equivalent per week    Family History  Problem Relation Age of Onset  . Hyperlipidemia Father   . Hypertension Father   . Cancer Maternal Grandmother        Uterine  . Diabetes Maternal Grandmother   . Cancer Paternal Grandmother        Lung  . Heart disease Paternal Grandfather   . Hyperlipidemia Paternal Grandfather   . Hypertension Paternal Grandfather     ROS Per hpi  OBJECTIVE:  Blood pressure 104/80, pulse 66, temperature 98.2 F (36.8 C), temperature source Oral, height 5\' 5"  (1.651 m), weight 239 lb (108.4 kg), last menstrual period 04/22/2018, SpO2 99 %. Body mass index is 39.77 kg/m.    Physical Exam Vitals signs and  nursing note reviewed.  Constitutional:      Appearance: She is well-developed.  HENT:     Head: Normocephalic and atraumatic.  Eyes:     General: No scleral icterus.    Conjunctiva/sclera: Conjunctivae normal.     Pupils: Pupils are equal, round, and reactive to light.  Neck:     Musculoskeletal: Neck supple.  Pulmonary:     Effort: Pulmonary effort is normal.  Skin:    General: Skin is warm and dry.  Neurological:     Mental Status: She is alert and oriented to person, place, and time.     ASSESSMENT and PLAN  1. Fibromyalgia Discussed management of chronic disease. Starting cymbalta, r/se/b reviewed. FMLA paperwork to be  completed  2. Need for influenza vaccination - Flu Vaccine QUAD 36+ mos IM  Other orders - DULoxetine (CYMBALTA) 30 MG capsule; Take 1 capsule (30 mg total) by mouth daily.    Return in about 4 weeks (around 05/27/2018).    Myles LippsIrma M Santiago, MD Primary Care at [facility] [address] [place], [place] [address] Ph.  [phone] Fax [phone]

## 2018-04-29 NOTE — Patient Instructions (Signed)
° ° ° °  If you have lab work done today you will be contacted with your lab results within the next 2 weeks.  If you have not heard from us then please contact us. The fastest way to get your results is to register for My Chart. ° ° °IF you received an x-ray today, you will receive an invoice from [facility]. Please contact [facility] at [phone] with questions or concerns regarding your invoice.  ° °IF you received labwork today, you will receive an invoice from [facility]. Please contact [facility] at [phone] with questions or concerns regarding your invoice.  ° °Our billing staff will not be able to assist you with questions regarding bills from these companies. ° °You will be contacted with the lab results as soon as they are available. The fastest way to get your results is to activate your My Chart account. Instructions are located on the last page of this paperwork. If you have not heard from us regarding the results in 2 weeks, please contact this office. °  ° ° ° °

## 2018-04-29 NOTE — Telephone Encounter (Signed)
LVM for patient to call and make an appointment with a provider here to est care and see if they would be willing to complete the forms for her. It looks like her Rheumatologist office told her that her PCP had to complete these forms but we have not seen the patient for this condition and we have not given her any special work accommodations and we have not taken her out of work so we cannot complete the forms without her establishing with someone here.  Please make her an appointment with a provider that is taking new patients to est care, we cannot overbook for this or put this in a sameday slot it will have to be in a normal OV.

## 2018-04-30 ENCOUNTER — Telehealth: Payer: Self-pay | Admitting: Family Medicine

## 2018-04-30 NOTE — Telephone Encounter (Signed)
Forms signed and back in FMLA box

## 2018-04-30 NOTE — Telephone Encounter (Signed)
Patient needs FMLA forms completed for her condition I have completed the forms based off the OV Notes and highlighted where the forms need to be signed. I will place the forms in Dr Adela GlimpseSantiago's box on 04/30/18 please return to the FMLA/Disability box at the check out desk with in 5-7 business days. Thank you!

## 2018-05-13 ENCOUNTER — Ambulatory Visit: Payer: Self-pay | Admitting: *Deleted

## 2018-05-13 NOTE — Telephone Encounter (Signed)
Summary: nausea, tremors, insomnia   DULoxetine (CYMBALTA) 30 MG capsule and wellbutrin might be interacting badly and causing nausea and chills/tremors, insomnia. Stopped taking cymbalta          Called patient back regarding her taking Cymbalta and Wellbutrin. She said that she tried taking the Cymbalta but started having chills, insomnia and nausea. That was on last Monday. She went to the ED but did not stay because she had to work. She is familiar with these medications because she had family that have been on these drugs. She tried taking the Cymbalta again last Friday, she was up for 48 hours. So she has decided that she is not taking it any more. The Wellbutrin is 150 mg and she takes it once a day. And she is also on Buspar 10 mg once a day. She wanted her pcp to know the reaction she had with taking these medications together. And if she has any suggestions, to give her a call back. Routing to flow at PCP.       Reason for Disposition . Caller requesting information about medication during pregnancy; adult is not ill and triager answers question  Answer Assessment - Initial Assessment Questions 1. SYMPTOMS: "Do you have any symptoms?"     Had symptoms last week 2. SEVERITY: If symptoms are present, ask "Are they mild, moderate or severe?"     Severe. None today.  Protocols used: MEDICATION QUESTION CALL-A-AH

## 2018-05-13 NOTE — Telephone Encounter (Signed)
Paperwork scanned and faxed on 05/13/18 °

## 2018-05-14 NOTE — Telephone Encounter (Signed)
Patient has an appointment with [place]

## 2018-05-15 NOTE — Telephone Encounter (Signed)
Detailed message left on voicemail.

## 2018-05-15 NOTE — Telephone Encounter (Signed)
Please let patient know that I agree with her holding off on cymbalta. We can discuss next steps at her OV. thanks

## 2018-05-17 NOTE — Telephone Encounter (Signed)
Patient husband is calling back and states that he is needing it faxed to [phone] and states the deadline is today.

## 2018-05-17 NOTE — Telephone Encounter (Signed)
Patient husband called to say that her employer did not receive paper work. Please refax and he will be in to pick up the forms also.

## 2018-05-26 ENCOUNTER — Other Ambulatory Visit: Payer: Self-pay | Admitting: Family Medicine

## 2018-05-27 NOTE — Telephone Encounter (Signed)
Requested Prescriptions  Pending Prescriptions Disp Refills  . DULoxetine (CYMBALTA) 30 MG capsule [Pharmacy Med Name: DULOXETINE HCL DR 30 MG CAP] 90 capsule 0    Sig: TAKE 1 CAPSULE BY MOUTH EVERY DAY     Psychiatry: Antidepressants - SNRI Passed - 05/26/2018 12:32 PM      Passed - Completed PHQ-2 or PHQ-9 in the last 360 days.      Passed - Last BP in normal range    BP Readings from Last 1 Encounters:  04/29/18 104/80         Passed - Valid encounter within last 6 months    Recent Outpatient Visits          4 weeks ago Fibromyalgia   Primary Care at [facility], [name], MD   3 months ago Urinary tract infection without hematuria, site unspecified   Primary Care at [facility], [name], PA-C   6 months ago Other chronic pain   Primary Care at [facility], [name], PA-C      Future Appointments            In 2 weeks [name], MD Primary Care at [place], [facility]

## 2018-06-10 ENCOUNTER — Encounter: Payer: Self-pay | Admitting: Family Medicine

## 2018-06-10 ENCOUNTER — Ambulatory Visit (INDEPENDENT_AMBULATORY_CARE_PROVIDER_SITE_OTHER): Payer: Managed Care, Other (non HMO) | Admitting: Family Medicine

## 2018-06-10 ENCOUNTER — Other Ambulatory Visit: Payer: Self-pay

## 2018-06-10 VITALS — BP 117/76 | HR 80 | Temp 97.9°F | Resp 18 | Ht 65.0 in | Wt 248.0 lb

## 2018-06-10 DIAGNOSIS — M797 Fibromyalgia: Secondary | ICD-10-CM

## 2018-06-10 DIAGNOSIS — R0982 Postnasal drip: Secondary | ICD-10-CM

## 2018-06-10 MED ORDER — GABAPENTIN 300 MG PO CAPS: 300.0000 mg | ORAL_CAPSULE | Freq: Every day | ORAL | 3 refills | Status: DC

## 2018-06-10 MED ORDER — FLUTICASONE PROPIONATE 50 MCG/ACT NA SUSP: 1.0000 | Freq: Two times a day (BID) | NASAL | 6 refills | Status: AC

## 2018-06-10 NOTE — Patient Instructions (Signed)
° ° ° °  If you have lab work done today you will be contacted with your lab results within the next 2 weeks.  If you have not heard from us then please contact us. The fastest way to get your results is to register for My Chart. ° ° °IF you received an x-ray today, you will receive an invoice from [facility]. Please contact [facility] at [phone] with questions or concerns regarding your invoice.  ° °IF you received labwork today, you will receive an invoice from [facility]. Please contact [facility] at [phone] with questions or concerns regarding your invoice.  ° °Our billing staff will not be able to assist you with questions regarding bills from these companies. ° °You will be contacted with the lab results as soon as they are available. The fastest way to get your results is to activate your My Chart account. Instructions are located on the last page of this paperwork. If you have not heard from us regarding the results in 2 weeks, please contact this office. °  ° ° ° °

## 2018-06-10 NOTE — Progress Notes (Signed)
2/3/20209:23 AM  Wanda Garrett [birth year], 23 y.o. female [id number]  Chief Complaint  Patient presents with  . Medication Refill    cymbalta made pt very sick and went to hospital now would like something new     HPI:   Patient is a 23 y.o. female with past medical history significant for fibromyalgia, chronic insomnia, bipolar disorder  who presents today for followup  Last OV dec 2019 Started on low dose cymbalta but had side effects "has shaking, trmbling in jaws and chills, "feels like jumping out of skin"- all intermittently" per ER note Her aunt also has fibromyalgia, and had similar reaction She takes zanaflex and does well on it Her mother is on flexeril She does suffer from sign insomnia  She is also c/o months of post nasal drip Not taking any medications   Fall Risk  06/10/2018 06/10/2018 04/29/2018 01/29/2018 11/16/2017  Falls in the past year? 0 0 0 No No  Number falls in past yr: 0 - - - -  Injury with Fall? 1 - - - -     Depression screen [facility] 2/9 06/10/2018 06/10/2018 04/29/2018  Decreased Interest 0 3 0  Down, Depressed, Hopeless 0 3 0  PHQ - 2 Score 0 6 0  Altered sleeping - 3 -  Tired, decreased energy - 3 -  Change in appetite - 0 -  Feeling bad or failure about yourself  - 1 -  Trouble concentrating - 0 -  Moving slowly or fidgety/restless - 0 -  Suicidal thoughts - 0 -  PHQ-9 Score - 13 -    Allergies  Allergen Reactions  . Cymbalta [Duloxetine Hcl] Nausea Only    Prior to Admission medications   Medication Sig Start Date End Date Taking? Authorizing Provider  BuPROPion HCl (WELLBUTRIN PO) Take by mouth.   Yes [provider]  busPIRone (BUSPAR) 10 MG tablet Take 10 mg by mouth 3 (three) times daily.   Yes [provider]  norgestimate-ethinyl estradiol (SPRINTEC 28) 0.25-35 MG-MCG tablet Take 1 tablet by mouth daily.   Yes [provider]  DULoxetine (CYMBALTA) 30 MG capsule TAKE 1 CAPSULE BY MOUTH EVERY DAY Patient  not taking: Reported on 06/10/2018 05/27/18   [name], MD    Past Medical History:  Diagnosis Date  . Anemia   . Asthma   . Depression   . PTSD (post-traumatic stress disorder)     No past surgical history on file.  Social History   Tobacco Use  . Smoking status: Former Smoker    Types: Cigarettes    Last attempt to quit: 05/19/2016    Years since quitting: 2.0  . Smokeless tobacco: Never Used  Substance Use Topics  . Alcohol use: Yes    Alcohol/week: 3.0 standard drinks    Types: 3 Standard drinks or equivalent per week    Family History  Problem Relation Age of Onset  . Hyperlipidemia Father   . Hypertension Father   . Cancer Maternal Grandmother        Uterine  . Diabetes Maternal Grandmother   . Cancer Paternal Grandmother        Lung  . Heart disease Paternal Grandfather   . Hyperlipidemia Paternal Grandfather   . Hypertension Paternal Grandfather     ROS Per hpi  OBJECTIVE:  Blood pressure 117/76, pulse 80, temperature 97.9 F (36.6 C), temperature source Oral, resp. rate 18, height 5\' 5"  (1.651 m), weight 248 lb (112.5 kg), last menstrual period  05/29/2018, SpO2 98 %. Body mass index is 41.27 kg/m.   Physical Exam Vitals signs and nursing note reviewed.  Constitutional:      Appearance: She is well-developed.  HENT:     Head: Normocephalic and atraumatic.     Right Ear: Hearing, tympanic membrane, ear canal and external ear normal.     Left Ear: Hearing, tympanic membrane, ear canal and external ear normal.     Nose:     Right Turbinates: Swollen and pale.     Left Turbinates: Swollen and pale.  Eyes:     General: No scleral icterus.    Conjunctiva/sclera: Conjunctivae normal.     Pupils: Pupils are equal, round, and reactive to light.  Neck:     Musculoskeletal: Neck supple.  Pulmonary:     Effort: Pulmonary effort is normal.  Skin:    General: Skin is warm and dry.  Neurological:     Mental Status: She is alert and oriented to  person, place, and time.      ASSESSMENT and PLAN  1. Fibromyalgia Uncontrolled. Side effects to cymbalta, discussed TCA, gabapentin, muscle relaxants, r/se/b as possible next steps Will do trial of gabapentin as might also help with insomnia.   2. Postnasal drip Start flonase. R/se/b  Other orders - gabapentin (NEURONTIN) 300 MG capsule; Take 1-2 capsules (300-600 mg total) by mouth at bedtime. - fluticasone (FLONASE) 50 MCG/ACT nasal spray; Place 1 spray into both nostrils 2 (two) times daily.  Return in about 4 weeks (around 07/08/2018) for fibromylagia.    [name], MD Primary Care at [facility] [address] [place], [place] [address] Ph.  [phone] Fax [phone]

## 2018-07-02 ENCOUNTER — Other Ambulatory Visit: Payer: Self-pay | Admitting: Family Medicine

## 2018-07-02 NOTE — Telephone Encounter (Signed)
Refill request for gabapentin #180; last refill 06/10/2018 #60; pt Dr [name] note dated 06/10/2018 pt to return to office around 07/08/2018; no upcoming visits noted; notified pt and she verbalizes understanding; contacted [name] at [facility] for assistance scheduling pt; pt offered and accepted appointment with Dr Koren Shiver, [address], 07/08/2018 at 0840; she verbalized understanding; will refill medications per pt request. Requested Prescriptions  Pending Prescriptions Disp Refills  . gabapentin (NEURONTIN) 300 MG capsule [Pharmacy Med Name: GABAPENTIN 300 MG CAPSULE] 180 capsule 2    Sig: TAKE 1-2 CAPSULES (300-600 MG TOTAL) BY MOUTH AT BEDTIME.     Neurology: Anticonvulsants - gabapentin Passed - 07/02/2018 11:39 AM      Passed - Valid encounter within last 12 months    Recent Outpatient Visits          3 weeks ago Fibromyalgia   [facility], [name], MD   2 months ago Fibromyalgia   [facility], [name], MD   5 months ago Urinary tract infection without hematuria, site unspecified   Primary Care at [facility], [name], PA-C   7 months ago Other chronic pain   Primary Care at [facility], [name], PA-C      Future Appointments            In 6 days [name], MD Primary Care at [place]

## 2018-07-08 ENCOUNTER — Ambulatory Visit (INDEPENDENT_AMBULATORY_CARE_PROVIDER_SITE_OTHER): Payer: Managed Care, Other (non HMO) | Admitting: Family Medicine

## 2018-07-08 ENCOUNTER — Other Ambulatory Visit: Payer: Self-pay

## 2018-07-08 ENCOUNTER — Encounter: Payer: Self-pay | Admitting: Family Medicine

## 2018-07-08 VITALS — BP 104/66 | HR 72 | Temp 97.7°F | Resp 20 | Ht 65.75 in | Wt 252.0 lb

## 2018-07-08 DIAGNOSIS — M797 Fibromyalgia: Secondary | ICD-10-CM | POA: Diagnosis not present

## 2018-07-08 MED ORDER — GABAPENTIN 100 MG PO CAPS: ORAL_CAPSULE | ORAL | 1 refills | Status: DC

## 2018-07-08 NOTE — Progress Notes (Signed)
3/2/20209:23 AM  Wanda Garrett [birth year], 23 y.o. female [id number]  Chief Complaint  Patient presents with  . Fibromyalgia    f/u  . Depression    score 14  . Anxiety    score 11    HPI:   Patient is a 23 y.o. female with past medical history significant for fibromyalgia, chronic insomnia, bipolar disorder ? who presents today for followup  Last OV feb 2020 - started on gabapentin for fibromyalgia Has been doing well on gabapentin 300mg  QHS only Sleeping well, waking up feeling very rested Pain is better in the morning, thru the day pain tends to come back in the afternoon She has been working on weight loss, got a bit "munchy" after getting an IUD in place a week ago, getting back on track She continued to see psych, had been off meds for a bit as she needed an OV, saw him last week She is also requesting referral to PT as this has helped sign in the past with her fibromyalgia  Fall Risk  07/08/2018 06/10/2018 06/10/2018 04/29/2018 01/29/2018  Falls in the past year? 0 0 0 0 No  Number falls in past yr: 0 0 - - -  Injury with Fall? 0 1 - - -  Follow up Falls evaluation completed - - - -     Depression screen [facility] 2/9 07/08/2018 06/10/2018 06/10/2018  Decreased Interest 1 0 3  Down, Depressed, Hopeless 3 0 3  PHQ - 2 Score 4 0 6  Altered sleeping 1 - 3  Tired, decreased energy 3 - 3  Change in appetite 3 - 0  Feeling bad or failure about yourself  2 - 1  Trouble concentrating 1 - 0  Moving slowly or fidgety/restless 0 - 0  Suicidal thoughts 0 - 0  PHQ-9 Score 14 - 13  Difficult doing work/chores Extremely dIfficult - -    Allergies  Allergen Reactions  . Cymbalta [Duloxetine Hcl] Nausea Only    Prior to Admission medications   Medication Sig Start Date End Date Taking? Authorizing Provider  BuPROPion HCl (WELLBUTRIN PO) Take by mouth.   Yes [provider]  busPIRone (BUSPAR) 10 MG tablet Take 10 mg by mouth 3 (three) times daily.   Yes [provider]   fluticasone (FLONASE) 50 MCG/ACT nasal spray Place 1 spray into both nostrils 2 (two) times daily. 06/10/18  Yes [name], MD  gabapentin (NEURONTIN) 300 MG capsule TAKE 1-2 CAPSULES (300-600 MG TOTAL) BY MOUTH AT BEDTIME. 07/02/18  Yes [name], MD  norgestimate-ethinyl estradiol (SPRINTEC 28) 0.25-35 MG-MCG tablet Take 1 tablet by mouth daily.    [provider]    Past Medical History:  Diagnosis Date  . Anemia   . Asthma   . Depression   . PTSD (post-traumatic stress disorder)     History reviewed. No pertinent surgical history.  Social History   Tobacco Use  . Smoking status: Former Smoker    Types: Cigarettes    Last attempt to quit: 05/19/2016    Years since quitting: 2.1  . Smokeless tobacco: Never Used  Substance Use Topics  . Alcohol use: Yes    Alcohol/week: 3.0 standard drinks    Types: 3 Standard drinks or equivalent per week    Family History  Problem Relation Age of Onset  . Hyperlipidemia Father   . Hypertension Father   . Cancer Maternal Grandmother        Uterine  . Diabetes Maternal  Grandmother   . Cancer Paternal Grandmother        Lung  . Heart disease Paternal Grandfather   . Hyperlipidemia Paternal Grandfather   . Hypertension Paternal Grandfather     ROS Per hpi  OBJECTIVE:  Blood pressure 104/66, pulse 72, temperature 97.7 F (36.5 C), temperature source Oral, resp. rate 20, height 5' 5.75" (1.67 m), weight 252 lb (114.3 kg), last menstrual period 06/26/2018, SpO2 96 %. Body mass index is 40.99 kg/m.   Wt Readings from Last 3 Encounters:  07/08/18 252 lb (114.3 kg)  06/10/18 248 lb (112.5 kg)  04/29/18 239 lb (108.4 kg)    Physical Exam Vitals signs and nursing note reviewed.  Constitutional:      Appearance: She is well-developed.  HENT:     Head: Normocephalic and atraumatic.  Eyes:     General: No scleral icterus.    Conjunctiva/sclera: Conjunctivae normal.     Pupils: Pupils are equal, round, and  reactive to light.  Neck:     Musculoskeletal: Neck supple.  Pulmonary:     Effort: Pulmonary effort is normal.  Skin:    General: Skin is warm and dry.  Neurological:     Mental Status: She is alert and oriented to person, place, and time.     ASSESSMENT and PLAN  1. Fibromyalgia Improving. Adding 100mg  gabapentin during the day. Cont with 300mg  at bedtime. Reviewed meds r/se/b. Referring to PT as requested.  - Ambulatory referral to Physical Therapy  Mood disorder managed by psych. Last OV a week ago  Other orders - gabapentin (NEURONTIN) 100 MG capsule; Take 1 capsule (100 mg total) by mouth daily after lunch AND 3 capsules (300 mg total) at bedtime.  Return in about 2 months (around 09/07/2018).   [name], MD Primary Care at [facility] [address] [place], [place] [address] Ph.  [phone] Fax [phone]

## 2018-07-08 NOTE — Patient Instructions (Signed)
° ° ° °  If you have lab work done today you will be contacted with your lab results within the next 2 weeks.  If you have not heard from us then please contact us. The fastest way to get your results is to register for My Chart. ° ° °IF you received an x-ray today, you will receive an invoice from [facility]. Please contact [facility] at [phone] with questions or concerns regarding your invoice.  ° °IF you received labwork today, you will receive an invoice from [facility]. Please contact [facility] at [phone] with questions or concerns regarding your invoice.  ° °Our billing staff will not be able to assist you with questions regarding bills from these companies. ° °You will be contacted with the lab results as soon as they are available. The fastest way to get your results is to activate your My Chart account. Instructions are located on the last page of this paperwork. If you have not heard from us regarding the results in 2 weeks, please contact this office. °  ° ° ° °

## 2018-07-26 ENCOUNTER — Telehealth: Payer: Managed Care, Other (non HMO) | Admitting: Nurse Practitioner

## 2018-07-26 DIAGNOSIS — J029 Acute pharyngitis, unspecified: Secondary | ICD-10-CM | POA: Diagnosis not present

## 2018-07-26 NOTE — Progress Notes (Signed)
We are sorry that you are not feeling well.  Here is how we plan to help!  Your symptoms indicate a likely viral infection (Pharyngitis).   Pharyngitis is inflammation in the back of the throat which can cause a sore throat, scratchiness and sometimes difficulty swallowing.   Pharyngitis is typically caused by a respiratory virus and will just run its course.  Please keep in mind that your symptoms could last up to 10 days.  For throat pain, we recommend over the counter oral pain relief medications such as acetaminophen or aspirin, or anti-inflammatory medications such as ibuprofen or naproxen sodium.  Topical treatments such as oral throat lozenges or sprays may be used as needed.  Avoid close contact with loved ones, especially the very young and elderly.  Remember to wash your hands thoroughly throughout the day as this is the number one way to prevent the spread of infection and wipe down door knobs and counters with disinfectant.  After careful review of your answers, I would not recommend and antibiotic for your condition.  Antibiotics should not be used to treat conditions that we suspect are caused by viruses like the virus that causes the common cold or flu. However, some people can have Strep with atypical symptoms. You may need formal testing in clinic or office to confirm if your symptoms continue or worsen.  Providers prescribe antibiotics to treat infections caused by bacteria. Antibiotics are very powerful in treating bacterial infections when they are used properly.  To maintain their effectiveness, they should be used only when necessary.  Overuse of antibiotics has resulted in the development of super bugs that are resistant to treatment!    Home Care:  Only take medications as instructed by your medical team.  Do not drink alcohol while taking these medications.  A steam or ultrasonic humidifier can help congestion.  You can place a towel over your head and breathe in the steam from  hot water coming from a faucet.  Avoid close contacts especially the very young and the elderly.  Cover your mouth when you cough or sneeze.  Always remember to wash your hands.  Get Help Right Away If:  You develop worsening fever or throat pain.  You develop a severe head ache or visual changes.  Your symptoms persist after you have completed your treatment plan.  Make sure you  Understand these instructions.  Will watch your condition.  Will get help right away if you are not doing well or get worse.  Your e-visit answers were reviewed by a board certified advanced clinical practitioner to complete your personal care plan.  Depending on the condition, your plan could have included both over the counter or prescription medications.  If there is a problem please reply  once you have received a response from your provider.  Your safety is important to us.  If you have drug allergies check your prescription carefully.    You can use MyChart to ask questions about todays visit, request a non-urgent call back, or ask for a work or school excuse for 24 hours related to this e-Visit. If it has been greater than 24 hours you will need to follow up with your provider, or enter a new e-Visit to address those concerns.  You will get an e-mail in the next two days asking about your experience.  I hope that your e-visit has been valuable and will speed your recovery. Thank you for using e-visits.   5 minutes spent reviewing   and documenting in chart.  

## 2018-07-29 ENCOUNTER — Telehealth: Payer: Managed Care, Other (non HMO) | Admitting: Family

## 2018-07-29 DIAGNOSIS — R6889 Other general symptoms and signs: Secondary | ICD-10-CM

## 2018-07-29 DIAGNOSIS — Z7189 Other specified counseling: Secondary | ICD-10-CM

## 2018-07-29 MED ORDER — ALBUTEROL SULFATE HFA 108 (90 BASE) MCG/ACT IN AERS: 2.0000 | INHALATION_SPRAY | Freq: Four times a day (QID) | RESPIRATORY_TRACT | 0 refills | Status: AC | PRN

## 2018-07-29 MED ORDER — BENZONATATE 100 MG PO CAPS: 100.0000 mg | ORAL_CAPSULE | Freq: Three times a day (TID) | ORAL | 0 refills | Status: DC | PRN

## 2018-07-29 NOTE — Progress Notes (Signed)
E-Visit for Corona Virus Screening  Based on your current symptoms, you may very well have the virus, however your symptoms are mild. Currently, not all patients are being tested. If the symptoms are mild and there is not a known exposure, performing the test is not indicated.  Called patient, states her SOB is stable. No distress on the phone. Will do albuterol inhaler as she has a hx of asthma. S/S of pneumonia discussed. States she is having fatigue and feels this is her blurred vision. No changes in gait or speech.   Approximately 5 minutes was spent documenting and reviewing patient's chart.   Coronavirus disease 2019 (COVID-19) is a respiratory illness that can spread from person to person. The virus that causes COVID-19 is a new virus that was first identified in the country of [place] but is now found in multiple other countries and has spread to the [place].  Symptoms associated with the virus are mild to severe fever, cough, and shortness of breath. There is currently no vaccine to protect against COVID-19, and there is no specific antiviral treatment for the virus.   To be considered HIGH RISK for Coronavirus (COVID-19), you have to meet the following criteria:  . Traveled to [place], [place], [place], [place] or [place]; or in the [place] to [place], [place], [place], or [place]; and have fever, cough, and shortness of breath within the last 2 weeks of travel OR  . Been in close contact with a person diagnosed with COVID-19 within the last 2 weeks and have fever, cough, and shortness of breath  . IF YOU DO NOT MEET THESE CRITERIA, YOU ARE CONSIDERED LOW RISK FOR COVID-19.   It is vitally important that if you feel that you have an infection such as this virus or any other virus that you stay home and away from places where you may spread it to others.  You should self-quarantine for 14 days if you have symptoms that could potentially be coronavirus and avoid contact with  people age 81 and older.   You can use medication such as A prescription cough medication called Tessalon Perles 100 mg. You may take 1-2 capsules every 8 hours as needed for cough and A prescription inhaler called Albuterol MDI 90 mcg /actuation 2 puffs every 4 hours as needed for shortness of breath, wheezing, cough  You may also take acetaminophen (Tylenol) as needed for fever.   Reduce your risk of any infection by using the same precautions used for avoiding the common cold or flu:  Marland Kitchen Wash your hands often with soap and warm water for at least 20 seconds.  If soap and water are not readily available, use an alcohol-based hand sanitizer with at least 60% alcohol.  . If coughing or sneezing, cover your mouth and nose by coughing or sneezing into the elbow areas of your shirt or coat, into a tissue or into your sleeve (not your hands). . Avoid shaking hands with others and consider head nods or verbal greetings only. . Avoid touching your eyes, nose, or mouth with unwashed hands.  . Avoid close contact with people who are sick. . Avoid places or events with large numbers of people in one location, like concerts or sporting events. . Carefully consider travel plans you have or are making. . If you are planning any travel outside or inside the [place], visit the CDC's Travelers' Health webpage for the latest health notices. . If you have some symptoms but  not all symptoms, continue to monitor at home and seek medical attention if your symptoms worsen. . If you are having a medical emergency, call 911.  HOME CARE . Only take medications as instructed by your medical team. . Drink plenty of fluids and get plenty of rest. . A steam or ultrasonic humidifier can help if you have congestion.   GET HELP RIGHT AWAY IF: . You develop worsening fever. . You become short of breath . You cough up blood. . Your symptoms become more severe MAKE SURE YOU   Understand these instructions.  Will watch your  condition.  Will get help right away if you are not doing well or get worse.  Your e-visit answers were reviewed by a board certified advanced clinical practitioner to complete your personal care plan.  Depending on the condition, your plan could have included both over the counter or prescription medications.  If there is a problem please reply once you have received a response from your provider. Your safety is important to [place].  If you have drug allergies check your prescription carefully.    You can use MyChart to ask questions about today's visit, request a non-urgent call back, or ask for a work or school excuse for 24 hours related to this e-Visit. If it has been greater than 24 hours you will need to follow up with your provider, or enter a new e-Visit to address those concerns. You will get an e-mail in the next two days asking about your experience.  I hope that your e-visit has been valuable and will speed your recovery. Thank you for using e-visits.

## 2018-08-04 ENCOUNTER — Other Ambulatory Visit: Payer: Self-pay | Admitting: Family Medicine

## 2018-08-17 ENCOUNTER — Other Ambulatory Visit: Payer: Self-pay | Admitting: Family

## 2018-08-17 DIAGNOSIS — R6889 Other general symptoms and signs: Secondary | ICD-10-CM

## 2018-08-17 DIAGNOSIS — Z7189 Other specified counseling: Secondary | ICD-10-CM

## 2018-09-25 ENCOUNTER — Telehealth: Payer: Self-pay | Admitting: Family Medicine

## 2018-09-25 NOTE — Telephone Encounter (Signed)
Called pt LVM for her to call office regarding appt for 09/27/2018   FR

## 2018-09-27 ENCOUNTER — Ambulatory Visit: Payer: Managed Care, Other (non HMO) | Admitting: Family Medicine

## 2018-10-24 ENCOUNTER — Ambulatory Visit: Payer: Managed Care, Other (non HMO) | Admitting: Family Medicine

## 2018-11-12 ENCOUNTER — Other Ambulatory Visit: Payer: Self-pay

## 2018-11-12 ENCOUNTER — Encounter: Payer: Self-pay | Admitting: Family Medicine

## 2018-11-12 ENCOUNTER — Ambulatory Visit (INDEPENDENT_AMBULATORY_CARE_PROVIDER_SITE_OTHER): Payer: Self-pay

## 2018-11-12 ENCOUNTER — Ambulatory Visit (INDEPENDENT_AMBULATORY_CARE_PROVIDER_SITE_OTHER): Payer: Self-pay | Admitting: Family Medicine

## 2018-11-12 VITALS — BP 115/78 | HR 97 | Temp 98.6°F | Ht 65.75 in | Wt 250.0 lb

## 2018-11-12 DIAGNOSIS — R197 Diarrhea, unspecified: Secondary | ICD-10-CM

## 2018-11-12 DIAGNOSIS — R1084 Generalized abdominal pain: Secondary | ICD-10-CM

## 2018-11-12 DIAGNOSIS — K5900 Constipation, unspecified: Secondary | ICD-10-CM

## 2018-11-12 MED ORDER — POLYETHYLENE GLYCOL 3350 17 GM/SCOOP PO POWD: 17.0000 g | Freq: Every day | ORAL | 1 refills | Status: DC

## 2018-11-12 NOTE — Progress Notes (Signed)
7/7/20203:58 PM  Wanda Garrett 1996-05-05, 23 y.o., female [id number]  Chief Complaint  Patient presents with  . Pain    stomach pain for the past 3-4 wks, not taking any meds for the symtoms. Says when she eats she feels the heaviness in the stomach, painful stool and some diarrhea   . GYN    she does have a gyn, they are not doing procedures right now     HPI:   Patient is a 23 y.o. female with past medical history significant for fibromylagia who presents today for abd pain  3-4 weeks of abd pain Started with intense painful sensation of food dropping into her stomach, felt as is she was not digesting, epigastric pain, associated with constipation Then cycled thru bloating, diarrhea, mid/lower abd pain, burning No reflux Has a hemorrhoid No sick contact Tried bland food, did not help Denies black tarry stools, did have couple of episodes of bright red blood prior  abd pain better with BM on constipation days but worse when having diarrhea No h/o IBS Today doing better, eating less  Her regular BMs were 3-4 a day, now having maybe one a day Paternal uncle has chrons and diverticulitis Paternal uncle has ulcerative colitis Father has diverticulitis Sister has IBS and celiac disease She has never had a colonoscopy This has never happened to her before No fever, chills, cough, SOB, URI sx No nausea or vomiting  Depression screen [facility] 2/9 07/08/2018 06/10/2018 06/10/2018  Decreased Interest 1 0 3  Down, Depressed, Hopeless 3 0 3  PHQ - 2 Score 4 0 6  Altered sleeping 1 - 3  Tired, decreased energy 3 - 3  Change in appetite 3 - 0  Feeling bad or failure about yourself  2 - 1  Trouble concentrating 1 - 0  Moving slowly or fidgety/restless 0 - 0  Suicidal thoughts 0 - 0  PHQ-9 Score 14 - 13  Difficult doing work/chores Extremely dIfficult - -    Fall Risk  11/12/2018 07/08/2018 06/10/2018 06/10/2018 04/29/2018  Falls in the past year? 0 0 0 0 0  Number falls in past yr: 0 0 0 - -   Injury with Fall? 0 0 1 - -  Follow up - Falls evaluation completed - - -     Allergies  Allergen Reactions  . Cymbalta [Duloxetine Hcl] Nausea Only    Prior to Admission medications   Medication Sig Start Date End Date Taking? Authorizing Provider  albuterol (PROVENTIL HFA;VENTOLIN HFA) 108 (90 Base) MCG/ACT inhaler Inhale 2 puffs into the lungs every 6 (six) hours as needed for wheezing or shortness of breath. 07/29/18  Yes Hawks, Christy A, FNP  BuPROPion HCl (WELLBUTRIN PO) Take by mouth.   Yes [provider]  busPIRone (BUSPAR) 10 MG tablet Take 10 mg by mouth 3 (three) times daily.   Yes [provider]  fluticasone (FLONASE) 50 MCG/ACT nasal spray Place 1 spray into both nostrils 2 (two) times daily. 06/10/18  Yes Rutherford Guys, MD  gabapentin (NEURONTIN) 100 MG capsule TAKE 1 CAPSULE (100 MG TOTAL) BY MOUTH DAILY AFTER LUNCH AND 3 CAPSULES (300 MG TOTAL) AT BEDTIME. 08/04/18  Yes Rutherford Guys, MD  levonorgestrel (MIRENA) 20 MCG/24HR IUD 1 each by Intrauterine route once. Placed by IUD 07/03/18  Yes [provider]    Past Medical History:  Diagnosis Date  . Anemia   . Asthma   . Depression   . PTSD (post-traumatic stress disorder)  No past surgical history on file.  Social History   Tobacco Use  . Smoking status: Former Smoker    Types: Cigarettes    Quit date: 05/19/2016    Years since quitting: 2.4  . Smokeless tobacco: Never Used  Substance Use Topics  . Alcohol use: Yes    Alcohol/week: 3.0 standard drinks    Types: 3 Standard drinks or equivalent per week    Family History  Problem Relation Age of Onset  . Hyperlipidemia Father   . Hypertension Father   . Cancer Maternal Grandmother        Uterine  . Diabetes Maternal Grandmother   . Cancer Paternal Grandmother        Lung  . Heart disease Paternal Grandfather   . Hyperlipidemia Paternal Grandfather   . Hypertension Paternal Grandfather     ROS Per hpi   OBJECTIVE:  Today's Vitals   11/12/18 1537  BP: 115/78  Pulse: 97  Temp: 98.6 F (37 C)  TempSrc: Oral  SpO2: 100%  Weight: 250 lb (113.4 kg)  Height: 5' 5.75" (1.67 m)   Body mass index is 40.66 kg/m.   Physical Exam Vitals signs and nursing note reviewed.  Constitutional:      Appearance: She is well-developed.  HENT:     Head: Normocephalic and atraumatic.     Mouth/Throat:     Pharynx: No oropharyngeal exudate.  Eyes:     General: No scleral icterus.    Conjunctiva/sclera: Conjunctivae normal.     Pupils: Pupils are equal, round, and reactive to light.  Neck:     Musculoskeletal: Neck supple.  Cardiovascular:     Rate and Rhythm: Normal rate and regular rhythm.     Heart sounds: Normal heart sounds. No murmur. No friction rub. No gallop.   Pulmonary:     Effort: Pulmonary effort is normal.     Breath sounds: Normal breath sounds. No wheezing or rales.  Abdominal:     General: Bowel sounds are normal. There is no distension.     Palpations: Abdomen is soft. There is no hepatomegaly or splenomegaly.     Tenderness: There is abdominal tenderness in the right upper quadrant, epigastric area and left lower quadrant. There is no guarding or rebound. Negative signs include Murphy's sign.     Comments: Most TTP in RUQ  Skin:    General: Skin is warm and dry.  Neurological:     Mental Status: She is alert and oriented to person, place, and time.    No results found.  AXR - no official read @ 632pm Read by me: large stool burden otherwise unremarkable  ASSESSMENT and PLAN  1. Constipation, unspecified constipation type Discussed supportive measures, new meds r/se/b and RTC precautions. Patient educational handout given.  2. Generalized abdominal pain - Comprehensive metabolic panel - Lipase - Sedimentation Rate - C-reactive protein - DG Abd 2 Views; Future  3. Diarrhea, unspecified type - CBC with Differential/Platelet - Comprehensive metabolic panel -  Lipase - Sedimentation Rate - C-reactive protein - DG Abd 2 Views; Future  Other orders - polyethylene glycol powder (GLYCOLAX/MIRALAX) 17 GM/SCOOP powder; Take 17 g by mouth daily.  Return if symptoms worsen or fail to improve.    Myles LippsIrma M Santiago, MD Primary [facility] at [facility] [address] [place], [place] [address] Ph.  [phone] Fax [phone]

## 2018-11-12 NOTE — Patient Instructions (Addendum)
  For constipation   Make sure you are drinking enough water daily. Make sure you are getting enough fiber in your diet - this will make you regular - you can eat high fiber foods or use metamucil as a supplement - it is really important to drink enough water when using fiber supplements.  If your stools are hard or are formed balls or you have to strain a stool softener will help - use colace 2-3 capsule a day  For gentle treatment of constipation Use Miralax 1-2 capfuls a day until your stools are soft and regular and then decrease the usage - you can use this daily  For more aggressive treatment of constipation Use 4 capfuls of Colace and 6 doses of Miralax and drink it in 2 hours - this should result in several watery stools - if it does not repeat the next day and then go to daily miralax for a week to make sure your bowels are clean and retrained to work properly  For the most aggressive treatment of constipation Use 14 capfuls of Miralax in 1 gallon of fluid (gatoraid or water work well or a combination of the two) and drink over 12h - it is ok to eat during this time and then use Miralax 1 capful daily for about 2 weeks to prevent the constipation from returning   If you have lab work done today you will be contacted with your lab results within the next 2 weeks.  If you have not heard from us then please contact us. The fastest way to get your results is to register for My Chart.   IF you received an x-ray today, you will receive an invoice from [facility]. Please contact [facility] at [phone] with questions or concerns regarding your invoice.   IF you received labwork today, you will receive an invoice from [facility]. Please contact [facility] at [phone] with questions or concerns regarding your invoice.   Our billing staff will not be able to assist you with questions regarding bills from these companies.  You will be contacted with the lab results as  soon as they are available. The fastest way to get your results is to activate your My Chart account. Instructions are located on the last page of this paperwork. If you have not heard from us regarding the results in 2 weeks, please contact this office.     

## 2018-11-13 LAB — CBC WITH DIFFERENTIAL/PLATELET
Basophils Absolute: 0.1 10*3/uL (ref 0.0–0.2)
Basos: 1 %
EOS (ABSOLUTE): 0.5 10*3/uL — ABNORMAL HIGH (ref 0.0–0.4)
Eos: 4 %
Hematocrit: 36.1 % (ref 34.0–46.6)
Hemoglobin: 11.5 g/dL (ref 11.1–15.9)
Immature Grans (Abs): 0 10*3/uL (ref 0.0–0.1)
Immature Granulocytes: 0 %
Lymphocytes Absolute: 2 10*3/uL (ref 0.7–3.1)
Lymphs: 18 %
MCH: 22.4 pg — ABNORMAL LOW (ref 26.6–33.0)
MCHC: 31.9 g/dL (ref 31.5–35.7)
MCV: 70 fL — ABNORMAL LOW (ref 79–97)
Monocytes Absolute: 0.6 10*3/uL (ref 0.1–0.9)
Monocytes: 5 %
Neutrophils Absolute: 7.9 10*3/uL — ABNORMAL HIGH (ref 1.4–7.0)
Neutrophils: 72 %
Platelets: 494 10*3/uL — ABNORMAL HIGH (ref 150–450)
RBC: 5.13 x10E6/uL (ref 3.77–5.28)
RDW: 15.4 % (ref 11.7–15.4)
WBC: 11 10*3/uL — ABNORMAL HIGH (ref 3.4–10.8)

## 2018-11-13 LAB — COMPREHENSIVE METABOLIC PANEL
ALT: 15 IU/L (ref 0–32)
AST: 12 IU/L (ref 0–40)
Albumin/Globulin Ratio: 1.8 (ref 1.2–2.2)
Albumin: 4.6 g/dL (ref 3.9–5.0)
Alkaline Phosphatase: 112 IU/L (ref 39–117)
BUN/Creatinine Ratio: 13 (ref 9–23)
BUN: 11 mg/dL (ref 6–20)
Bilirubin Total: <0.2 mg/dL (ref 0.0–1.2)
CO2: 22 mmol/L (ref 20–29)
Calcium: 10 mg/dL (ref 8.7–10.2)
Chloride: 101 mmol/L (ref 96–106)
Creatinine, Ser: 0.82 mg/dL (ref 0.57–1.00)
GFR calc Af Amer: 116 mL/min/{1.73_m2} (ref >59)
GFR calc non Af Amer: 100 mL/min/{1.73_m2} (ref >59)
Globulin, Total: 2.6 g/dL (ref 1.5–4.5)
Glucose: 91 mg/dL (ref 65–99)
Potassium: 4.5 mmol/L (ref 3.5–5.2)
Sodium: 140 mmol/L (ref 134–144)
Total Protein: 7.2 g/dL (ref 6.0–8.5)

## 2018-11-13 LAB — C-REACTIVE PROTEIN: CRP: 32 mg/L — ABNORMAL HIGH (ref 0–10)

## 2018-11-13 LAB — SEDIMENTATION RATE: Sed Rate: 66 mm/hr — ABNORMAL HIGH (ref 0–32)

## 2018-11-13 LAB — LIPASE: Lipase: 20 U/L (ref 14–72)

## 2018-12-05 ENCOUNTER — Encounter: Payer: Self-pay | Admitting: Family Medicine

## 2018-12-13 ENCOUNTER — Telehealth (INDEPENDENT_AMBULATORY_CARE_PROVIDER_SITE_OTHER): Payer: Managed Care, Other (non HMO) | Admitting: Family Medicine

## 2018-12-13 ENCOUNTER — Other Ambulatory Visit: Payer: Self-pay

## 2018-12-13 DIAGNOSIS — Z8379 Family history of other diseases of the digestive system: Secondary | ICD-10-CM

## 2018-12-13 DIAGNOSIS — K5900 Constipation, unspecified: Secondary | ICD-10-CM

## 2018-12-13 DIAGNOSIS — Z862 Personal history of diseases of the blood and blood-forming organs and certain disorders involving the immune mechanism: Secondary | ICD-10-CM

## 2018-12-13 DIAGNOSIS — R1084 Generalized abdominal pain: Secondary | ICD-10-CM

## 2018-12-13 MED ORDER — MAGNESIUM OXIDE 400 MG PO TABS: 400.0000 mg | ORAL_TABLET | Freq: Two times a day (BID) | ORAL | 3 refills | Status: DC

## 2018-12-13 NOTE — Progress Notes (Signed)
Gi Referral and still having stomach issue. Can you do test about Glutin allergies and Ciliac diease before the referral because it runs in the family.

## 2018-12-13 NOTE — Progress Notes (Signed)
Virtual Visit Note  I connected with patient on 12/13/18 at 557pm by video doximity and verified that I am speaking with the correct person using two identifiers. Wanda Garrett is currently located at home and patient is currently with them during visit. The provider, Rutherford Guys, MD is located in their office at time of visit.  I discussed the limitations, risks, security and privacy concerns of performing an evaluation and management service by telephone and the availability of in person appointments. I also discussed with the patient that there may be a patient responsible charge related to this service. The patient expressed understanding and agreed to proceed.   CC: abd pain  HPI ? Seen in July 2020 for abd pain with alerating constipation and diarrhea Trial of miralax  Patient reports that she has continued to suffer from significant epigastric abd pain, bloating and constipation, specially when she eats bread.   She has done trials off bread and she has no GI issues including resolution of constipation She has not had anymore diarrhea She wants to be tested for both gluten sensitivity and celiac disease She also would like to see GI given her fhx  miralax works for constipation but causes strong urge which is difficult to manage when she is working  Paternal uncle has chrons and diverticulitis Paternal uncle has ulcerative colitis Father has diverticulitis Sister has IBS and celiac disease  Lab Results  Component Value Date   CRP 32 (H) 11/12/2018   Lab Results  Component Value Date   ESRSEDRATE 66 (H) 11/12/2018   CBC Latest Ref Rng & Units 11/12/2018 11/16/2017 08/31/2017  WBC 3.4 - 10.8 x10E3/uL 11.0(H) 9.8 11.2(H)  Hemoglobin 11.1 - 15.9 g/dL 11.5 10.0(L) 10.7(L)  Hematocrit 34.0 - 46.6 % 36.1 32.5(L) 34.4(L)  Platelets 150 - 450 x10E3/uL 494(H) 425 356    Allergies  Allergen Reactions  . Cymbalta [Duloxetine Hcl] Nausea Only    Prior to Admission  medications   Medication Sig Start Date End Date Taking? Authorizing Provider  albuterol (PROVENTIL HFA;VENTOLIN HFA) 108 (90 Base) MCG/ACT inhaler Inhale 2 puffs into the lungs every 6 (six) hours as needed for wheezing or shortness of breath. 07/29/18  Yes Hawks, Christy A, FNP  BuPROPion HCl (WELLBUTRIN PO) Take 150 mg by mouth daily.    Yes [provider]  busPIRone (BUSPAR) 10 MG tablet Take 5 mg by mouth daily.    Yes [provider]  fluticasone (FLONASE) 50 MCG/ACT nasal spray Place 1 spray into both nostrils 2 (two) times daily. 06/10/18  Yes Rutherford Guys, MD  gabapentin (NEURONTIN) 100 MG capsule TAKE 1 CAPSULE (100 MG TOTAL) BY MOUTH DAILY AFTER LUNCH AND 3 CAPSULES (300 MG TOTAL) AT BEDTIME. 08/04/18  Yes Rutherford Guys, MD  levonorgestrel (MIRENA) 20 MCG/24HR IUD 1 each by Intrauterine route once. Placed by IUD 07/03/18  Yes [provider]  polyethylene glycol powder (GLYCOLAX/MIRALAX) 17 GM/SCOOP powder Take 17 g by mouth daily. Patient not taking: Reported on 12/13/2018 11/12/18   Rutherford Guys, MD    Past Medical History:  Diagnosis Date  . Anemia   . Asthma   . Depression   . PTSD (post-traumatic stress disorder)     No past surgical history on file.  Social History   Tobacco Use  . Smoking status: Former Smoker    Types: Cigarettes    Quit date: 05/19/2016    Years since quitting: 2.5  . Smokeless tobacco: Never Used  Substance  Use Topics  . Alcohol use: Yes    Alcohol/week: 3.0 standard drinks    Types: 3 Standard drinks or equivalent per week    Family History  Problem Relation Age of Onset  . Hyperlipidemia Father   . Hypertension Father   . Cancer Maternal Grandmother        Uterine  . Diabetes Maternal Grandmother   . Cancer Paternal Grandmother        Lung  . Heart disease Paternal Grandfather   . Hyperlipidemia Paternal Grandfather   . Hypertension Paternal Grandfather     ROS  Objective  Vitals as reported by  the patient:   ASSESSMENT and PLAN  1. Generalized abdominal pain - Celiac Disease Ab Screen w/Rfx; Future - Allergen Gluten f79; Future - Ambulatory referral to Gastroenterology  2. Constipation, unspecified constipation type Trial of mg oxide - Celiac Disease Ab Screen w/Rfx; Future - Allergen Gluten f79; Future - Ambulatory referral to Gastroenterology  3. History of anemia - Celiac Disease Ab Screen w/Rfx; Future - Allergen Gluten f79; Future - Ambulatory referral to Gastroenterology  4. FHx: inflammatory bowel disease - Ambulatory referral to Gastroenterology  5. FHx: celiac disease - Celiac Disease Ab Screen w/Rfx; Future - Allergen Gluten f79; Future - Ambulatory referral to Gastroenterology  Other orders - magnesium oxide (MAG-OX) 400 MG tablet; Take 1 tablet (400 mg total) by mouth 2 (two) times daily.  FOLLOW-UP: after GI   The above assessment and management plan was discussed with the patient. The patient verbalized understanding of and has agreed to the management plan. Patient is aware to call the clinic if symptoms persist or worsen. Patient is aware when to return to the clinic for a follow-up visit. Patient educated on when it is appropriate to go to the emergency department.    I provided 10 minutes of non-face-to-face time during this encounter.  Myles LippsIrma M Santiago, MD [facility] at [facility] [address] [place], [place] [address] Ph.  [phone] Fax [phone]

## 2018-12-13 NOTE — Patient Instructions (Signed)
° ° ° °  If you have lab work done today you will be contacted with your lab results within the next 2 weeks.  If you have not heard from us then please contact us. The fastest way to get your results is to register for My Chart. ° ° °IF you received an x-ray today, you will receive an invoice from [facility]. Please contact [facility] at [phone] with questions or concerns regarding your invoice.  ° °IF you received labwork today, you will receive an invoice from [facility]. Please contact [facility] at [phone] with questions or concerns regarding your invoice.  ° °Our billing staff will not be able to assist you with questions regarding bills from these companies. ° °You will be contacted with the lab results as soon as they are available. The fastest way to get your results is to activate your My Chart account. Instructions are located on the last page of this paperwork. If you have not heard from us regarding the results in 2 weeks, please contact this office. °  ° ° ° °

## 2018-12-16 ENCOUNTER — Other Ambulatory Visit: Payer: Self-pay

## 2018-12-16 ENCOUNTER — Ambulatory Visit: Payer: Self-pay

## 2018-12-16 DIAGNOSIS — Z8379 Family history of other diseases of the digestive system: Secondary | ICD-10-CM

## 2018-12-16 DIAGNOSIS — Z862 Personal history of diseases of the blood and blood-forming organs and certain disorders involving the immune mechanism: Secondary | ICD-10-CM

## 2018-12-16 DIAGNOSIS — K5900 Constipation, unspecified: Secondary | ICD-10-CM

## 2018-12-16 DIAGNOSIS — R1084 Generalized abdominal pain: Secondary | ICD-10-CM

## 2018-12-20 LAB — CELIAC DISEASE AB SCREEN W/RFX
Antigliadin Abs, IgA: 6 units (ref 0–19)
IgA/Immunoglobulin A, Serum: 281 mg/dL (ref 87–352)
Transglutaminase IgA: <2 U/mL (ref 0–3)

## 2018-12-20 LAB — ALLERGEN GLUTEN F79: Allergen Gluten IgE: <0.1 kU/L

## 2018-12-21 ENCOUNTER — Encounter: Payer: Self-pay | Admitting: Family Medicine

## 2019-01-01 ENCOUNTER — Ambulatory Visit (INDEPENDENT_AMBULATORY_CARE_PROVIDER_SITE_OTHER): Payer: Self-pay | Admitting: Gastroenterology

## 2019-01-01 ENCOUNTER — Encounter: Payer: Self-pay | Admitting: Gastroenterology

## 2019-01-01 ENCOUNTER — Other Ambulatory Visit: Payer: Self-pay

## 2019-01-01 VITALS — BP 100/60 | HR 84 | Temp 98.4°F | Ht 65.0 in | Wt 248.0 lb

## 2019-01-01 DIAGNOSIS — Z8379 Family history of other diseases of the digestive system: Secondary | ICD-10-CM

## 2019-01-01 DIAGNOSIS — R7982 Elevated C-reactive protein (CRP): Secondary | ICD-10-CM

## 2019-01-01 DIAGNOSIS — R634 Abnormal weight loss: Secondary | ICD-10-CM

## 2019-01-01 DIAGNOSIS — R110 Nausea: Secondary | ICD-10-CM

## 2019-01-01 DIAGNOSIS — K5900 Constipation, unspecified: Secondary | ICD-10-CM

## 2019-01-01 DIAGNOSIS — R1084 Generalized abdominal pain: Secondary | ICD-10-CM

## 2019-01-01 MED ORDER — NA SULFATE-K SULFATE-MG SULF 17.5-3.13-1.6 GM/177ML PO SOLN: 1.0000 | ORAL | 0 refills | Status: DC

## 2019-01-01 NOTE — Patient Instructions (Signed)
You have been scheduled for an endoscopy and colonoscopy. Please follow the written instructions given to you at your visit today. Please pick up your prep supplies at the pharmacy within the next 1-3 days. If you use inhalers (even only as needed), please bring them with you on the day of your procedure.  Your provider has requested that you go to the basement level for lab work before leaving today. Press "B" on the elevator. The lab is located at the first door on the left as you exit the elevator.   

## 2019-01-01 NOTE — Progress Notes (Signed)
Chief Complaint: Abdominal pain, constipation, nausea  Referring Provider:     Rutherford Guys, MD  HPI:    Wanda Garrett is a 23 y.o. female referred to the Gastroenterology Clinic for evaluation of multiple GI symptoms, to include generalized abdominal pain, constipation, nausea without emesis.  Abd pain and bloating started in May. Thought it was stress related (job stress; has since left that job). Also with mid abdominal burning pain. No improvement with OTC meds/household remedies.  Was seen by her PCM, and abd X ray with constipation, but didn't necessarily endorse clinical constipation. Trialed Miralax x2-3 weeks with some BMs, but not regular bowel habits. Then trialed Mag Oxide with worsening of sxs and straining to have BM with pellet like stools. No hematochezia.  No prior history of constipation.  Trialed OTC Dulcolax and Epsom salt without change.  Now having 1 BM every 3 to 4 days.  Drinks plenty of water, has increased dietary fiber.  As her sister has Celiac Disease, she trialed GFD x3 days with improvement in abd pain and bloating, but no change in constipation. Has lost 15# in the last 2 weeks or so. No fever. Nausea without emesis in the last week, but improving. +chills without night sweats.  No skin rashes.  No arthralgias.  Was seen by her PCM for this issue in 11/2018. Elevated CRP/ESR (32/66), WBC 11, normal H/H but MCV 70 (historically high 60s/low 70s and hemoglobin 10's).  Normal CMP.  Interestingly had elevated ESR/CRP in 2019 as well.  Celiac panel negative.   Was seen in [place] ER last weekend with ongoing GI sxs. WBC 11.1, H/H 11.9/39, MCV 74. CMP normal. Per patient, CT negative, but this study was not seen in EMR. Treated with enema and discharged to home.  Had a CT in 2019 demonstrating mildly distended fluid-filled loops of the small/large bowel, suspicious for enteritis.  Family history notable for: Paternal uncle with Crohn's disease and  diverticulitis Paternal uncle with ulcerative colitis Father with diverticulitis Sister with IBS and Celiac Disease Mother with MS Maternal aunt with SLE  No previous EGD or colonoscopy.  Past Medical History:  Diagnosis Date  . Anemia   . Anxiety   . Asthma   . Depression   . Fibromyalgia   . PTSD (post-traumatic stress disorder)      Past Surgical History:  Procedure Laterality Date  . NO PAST SURGERIES     Family History  Problem Relation Age of Onset  . Multiple sclerosis Mother   . Colon polyps Mother   . Sarcoidosis Mother   . Hyperlipidemia Father   . Hypertension Father   . Diverticulitis Father   . Other Father        cystic fibrosis carrier  . Celiac disease Sister   . Other Sister        cystic fibrosis carrier  . Diabetes Maternal Grandmother   . Uterine cancer Maternal Grandmother   . Lung cancer Paternal Grandmother   . Heart disease Paternal Grandfather   . Hyperlipidemia Paternal Grandfather   . Hypertension Paternal Grandfather   . Crohn's disease Paternal Uncle   . Ulcerative colitis Paternal Uncle   . Lupus Maternal Aunt   . Cystic fibrosis Cousin    Social History   Tobacco Use  . Smoking status: Former Smoker    Types: Cigarettes    Quit date: 05/19/2016    Years since quitting: 2.6  .  Smokeless tobacco: Never Used  Substance Use Topics  . Alcohol use: Yes    Alcohol/week: 3.0 standard drinks    Types: 3 Standard drinks or equivalent per week    Comment: occasional  . Drug use: Yes    Types: Marijuana   Current Outpatient Medications  Medication Sig Dispense Refill  . albuterol (PROVENTIL HFA;VENTOLIN HFA) 108 (90 Base) MCG/ACT inhaler Inhale 2 puffs into the lungs every 6 (six) hours as needed for wheezing or shortness of breath. 1 Inhaler 0  . Atomoxetine HCl (STRATTERA PO) Take 1 tablet by mouth daily.    . BuPROPion HCl (WELLBUTRIN PO) Take 150 mg by mouth daily.     . fluticasone (FLONASE) 50 MCG/ACT nasal spray Place 1  spray into both nostrils 2 (two) times daily. 16 g 6  . gabapentin (NEURONTIN) 100 MG capsule TAKE 1 CAPSULE (100 MG TOTAL) BY MOUTH DAILY AFTER LUNCH AND 3 CAPSULES (300 MG TOTAL) AT BEDTIME. 360 capsule 1  . levonorgestrel (MIRENA) 20 MCG/24HR IUD 1 each by Intrauterine route once. Placed by IUD     No current facility-administered medications for this visit.    Allergies  Allergen Reactions  . Cymbalta [Duloxetine Hcl] Nausea Only     Review of Systems: All systems reviewed and negative except where noted in HPI.     Physical Exam:    Wt Readings from Last 3 Encounters:  01/01/19 248 lb (112.5 kg)  11/12/18 250 lb (113.4 kg)  07/08/18 252 lb (114.3 kg)    BP 100/60 (BP Location: Left Arm, Patient Position: Sitting, Cuff Size: Large)   Pulse 84   Temp 98.4 F (36.9 C)   Ht _0  (1.651 m) Comment: height measured without shoes  Wt 248 lb (112.5 kg)   BMI 41.27 kg/m  Constitutional:  Pleasant, in no acute distress. Psychiatric: Normal mood and affect. Behavior is normal. EENT: Pupils normal.  Conjunctivae are normal. No scleral icterus. Neck supple. No cervical LAD. Cardiovascular: Normal rate, regular rhythm. No edema Pulmonary/chest: Effort normal and breath sounds normal. No wheezing, rales or rhonchi. Abdominal: Mild TTP throughout abdomen, without rebound or guarding.  Soft, nondistended. Bowel sounds active throughout. There are no masses palpable. No hepatomegaly. Neurological: Alert and oriented to person place and time. Skin: Skin is warm and dry. No rashes noted.   ASSESSMENT AND PLAN;   1) Generalized abdominal pain 2) Change in bowel habits 3) Elevated inflammatory markers 4) Microcytic anemia 5) Nausea without emesis 6) Weight loss 7) Family history of IBD and Celiac Disease  -EGD with gastric and duodenal biopsies -Colonoscopy with random directed biopsies -Check vitamin D, B12, folate, iron panel, ferritin -Resume high-fiber diet and plenty of  fluids as already doing   The indications, risks, and benefits of EGD and colonoscopy were explained to the patient in detail. Risks include but are not limited to bleeding, perforation, adverse reaction to medications, and cardiopulmonary compromise. Sequelae include but are not limited to the possibility of surgery, hositalization, and mortality. The patient verbalized understanding and wished to proceed. All questions answered, referred to scheduler and bowel prep ordered. Further recommendations pending results of the exam.    Lavena Bullion, DO, FACG  01/01/2019, 2:35 PM   Rutherford Guys, MD

## 2019-01-06 ENCOUNTER — Other Ambulatory Visit (INDEPENDENT_AMBULATORY_CARE_PROVIDER_SITE_OTHER): Payer: Self-pay

## 2019-01-06 DIAGNOSIS — K5900 Constipation, unspecified: Secondary | ICD-10-CM

## 2019-01-06 DIAGNOSIS — R634 Abnormal weight loss: Secondary | ICD-10-CM

## 2019-01-06 DIAGNOSIS — R1084 Generalized abdominal pain: Secondary | ICD-10-CM

## 2019-01-06 LAB — FOLATE: Folate: 14.4 ng/mL (ref >5.9)

## 2019-01-06 LAB — VITAMIN D 25 HYDROXY (VIT D DEFICIENCY, FRACTURES): VITD: 25.43 ng/mL — ABNORMAL LOW (ref 30.00–100.00)

## 2019-01-06 LAB — IBC PANEL
Iron: 32 ug/dL — ABNORMAL LOW (ref 42–145)
Saturation Ratios: 8.8 % — ABNORMAL LOW (ref 20.0–50.0)
Transferrin: 260 mg/dL (ref 212.0–360.0)

## 2019-01-06 LAB — VITAMIN B12: Vitamin B-12: 369 pg/mL (ref 211–911)

## 2019-01-06 LAB — FERRITIN: Ferritin: 23.6 ng/mL (ref 10.0–291.0)

## 2019-01-09 ENCOUNTER — Telehealth: Payer: Self-pay | Admitting: Gastroenterology

## 2019-01-09 NOTE — Telephone Encounter (Signed)
Please see additional documentation concerning this patient 

## 2019-01-17 ENCOUNTER — Telehealth: Payer: Self-pay | Admitting: Gastroenterology

## 2019-01-17 NOTE — Telephone Encounter (Signed)
Pt states that suprep was not sent to her pharmacy, her procedure is on Monday.

## 2019-01-20 ENCOUNTER — Other Ambulatory Visit: Payer: Self-pay

## 2019-01-20 ENCOUNTER — Encounter: Payer: Self-pay | Admitting: Gastroenterology

## 2019-01-20 ENCOUNTER — Ambulatory Visit (AMBULATORY_SURGERY_CENTER): Payer: Self-pay | Admitting: Gastroenterology

## 2019-01-20 VITALS — BP 119/72 | HR 83 | Temp 98.2°F | Resp 13 | Ht 65.0 in | Wt 248.0 lb

## 2019-01-20 DIAGNOSIS — K5900 Constipation, unspecified: Secondary | ICD-10-CM

## 2019-01-20 DIAGNOSIS — K2970 Gastritis, unspecified, without bleeding: Secondary | ICD-10-CM

## 2019-01-20 DIAGNOSIS — Z8379 Family history of other diseases of the digestive system: Secondary | ICD-10-CM

## 2019-01-20 DIAGNOSIS — R194 Change in bowel habit: Secondary | ICD-10-CM

## 2019-01-20 DIAGNOSIS — R140 Abdominal distension (gaseous): Secondary | ICD-10-CM

## 2019-01-20 DIAGNOSIS — R1084 Generalized abdominal pain: Secondary | ICD-10-CM

## 2019-01-20 MED ORDER — SODIUM CHLORIDE 0.9 % IV SOLN: 500.0000 mL | Freq: Once | INTRAVENOUS | Status: DC

## 2019-01-20 NOTE — Patient Instructions (Signed)
YOU HAD AN ENDOSCOPIC PROCEDURE TODAY AT THE [facility]:   Refer to the procedure report that was given to you for any specific questions about what was found during the examination.  If the procedure report does not answer your questions, please call your gastroenterologist to clarify.  If you requested that your care partner not be given the details of your procedure findings, then the procedure report has been included in a sealed envelope for you to review at your convenience later.  YOU SHOULD EXPECT: Some feelings of bloating in the abdomen. Passage of more gas than usual.  Walking can help get rid of the air that was put into your GI tract during the procedure and reduce the bloating. If you had a lower endoscopy (such as a colonoscopy or flexible sigmoidoscopy) you may notice spotting of blood in your stool or on the toilet paper. If you underwent a bowel prep for your procedure, you may not have a normal bowel movement for a few days.  Please Note:  You might notice some irritation and congestion in your nose or some drainage.  This is from the oxygen used during your procedure.  There is no need for concern and it should clear up in a day or so.  SYMPTOMS TO REPORT IMMEDIATELY:   Following lower endoscopy (colonoscopy or flexible sigmoidoscopy):  Excessive amounts of blood in the stool  Significant tenderness or worsening of abdominal pains  Swelling of the abdomen that is new, acute  Fever of 100F or higher   Following upper endoscopy (EGD)  Vomiting of blood or coffee ground material  New chest pain or pain under the shoulder blades  Painful or persistently difficult swallowing  New shortness of breath  Fever of 100F or higher  Black, tarry-looking stools  For urgent or emergent issues, a gastroenterologist can be reached at any hour by calling [phone].   DIET:  We do recommend a small meal at first, but then you may proceed to your regular diet.  Drink  plenty of fluids but you should avoid alcoholic beverages for 24 hours.  ACTIVITY:  You should plan to take it easy for the rest of today and you should NOT DRIVE or use heavy machinery until tomorrow (because of the sedation medicines used during the test).    FOLLOW UP: Our staff will call the number listed on your records 48-72 hours following your procedure to check on you and address any questions or concerns that you may have regarding the information given to you following your procedure. If we do not reach you, we will leave a message.  We will attempt to reach you two times.  During this call, we will ask if you have developed any symptoms of COVID 19. If you develop any symptoms (ie: fever, flu-like symptoms, shortness of breath, cough etc.) before then, please call [phone].  If you test positive for Covid 19 in the 2 weeks post procedure, please call and report this information to us.    If any biopsies were taken you will be contacted by phone or by letter within the next 1-3 weeks.  Please call us at [phone] if you have not heard about the biopsies in 3 weeks.    SIGNATURES/CONFIDENTIALITY: You and/or your care partner have signed paperwork which will be entered into your electronic medical record.  These signatures attest to the fact that that the information above on your After Visit Summary has been reviewed and is   understood.  Full responsibility of the confidentiality of this discharge information lies with you and/or your care-partner.     You may resume your current medications today. Await biopsy results. The office will call you with an appointment for to follow up office in 2 -3 months. Please call if any questions or concerns.

## 2019-01-20 NOTE — Progress Notes (Signed)
Temp taken by JB VS taken by CW 

## 2019-01-20 NOTE — Op Note (Signed)
[place] [facility] Patient Name: Chaniya Genter Procedure Date: 01/20/2019 1:41 PM MRN: [id number] Endoscopist: Gerrit Heck , MD Age: 23 Referring MD:  Date of Birth: August 12, 1995 Gender: Female Account #: [id number] Procedure:                Upper GI endoscopy Indications:              Epigastric abdominal pain, , Abdominal bloating,                            Nausea, Weight loss                           23 yo female with family history of Celiac Disease                            (sister) and IBD (uncles x2) presents with change                            in bowel habits, generalized and MEG pain, nausea,                            weight loss, bloating and recent labs notable for                            elevated ESR, CRP and WBC (11). Medicines:                Monitored Anesthesia Care Procedure:                Pre-Anesthesia Assessment:                           - Prior to the procedure, a History and Physical                            was performed, and patient medications and                            allergies were reviewed. The patient's tolerance of                            previous anesthesia was also reviewed. The risks                            and benefits of the procedure and the sedation                            options and risks were discussed with the patient.                            All questions were answered, and informed consent                            was obtained. Prior Anticoagulants: The patient has  taken no previous anticoagulant or antiplatelet                            agents. ASA Grade Assessment: II - A patient with                            mild systemic disease. After reviewing the risks                            and benefits, the patient was deemed in                            satisfactory condition to undergo the procedure.                           After obtaining informed consent, the endoscope was                             passed under direct vision. Throughout the                            procedure, the patient's blood pressure, pulse, and                            oxygen saturations were monitored continuously. The                            Endoscope was introduced through the mouth, and                            advanced to the second part of duodenum. The upper                            GI endoscopy was accomplished without difficulty.                            The patient tolerated the procedure well. Scope In: Scope Out: Findings:                 The examined esophagus was normal.                           The gastroesophageal flap valve was visualized                            endoscopically and classified as Hill Grade II                            (fold present, opens with respiration).                           The entire examined stomach was normal. Biopsies                            were taken with  a cold forceps for Helicobacter                            pylori testing. Estimated blood loss was minimal.                           The duodenal bulb, first portion of the duodenum                            and second portion of the duodenum were normal.                            Biopsies for histology were taken with a cold                            forceps for evaluation of celiac disease. Estimated                            blood loss was minimal. Complications:            No immediate complications. Estimated Blood Loss:     Estimated blood loss was minimal. Impression:               - Normal esophagus.                           - Gastroesophageal flap valve classified as Hill                            Grade II (fold present, opens with respiration).                           - Normal stomach. Biopsied.                           - Normal duodenal bulb, first portion of the                            duodenum and second portion of the duodenum.                             Biopsied. Recommendation:           - Patient has a contact number available for                            emergencies. The signs and symptoms of potential                            delayed complications were discussed with the                            patient. Return to normal activities tomorrow.                            Written discharge instructions were provided to the  patient.                           - Resume previous diet.                           - Continue present medications.                           - Await pathology results.                           - Perform a colonoscopy today. Gerrit Heck, MD 01/20/2019 2:10:18 PM

## 2019-01-20 NOTE — Progress Notes (Signed)
Report to PACU, RN, vss, BBS= Clear.  

## 2019-01-20 NOTE — Progress Notes (Signed)
Called to room to assist during endoscopic procedure.  Patient ID and intended procedure confirmed with present staff. Received instructions for my participation in the procedure from the performing physician.  

## 2019-01-20 NOTE — Progress Notes (Signed)
No problems noted in the recovery room. maw 

## 2019-01-20 NOTE — Op Note (Signed)
[facility] Patient Name: Wanda Garrett Procedure Date: 01/20/2019 1:41 PM MRN: [id number] Endoscopist: Gerrit Heck , MD Age: 23 Referring MD:  Date of Birth: 09/03/1995 Gender: Female Account #: [id number] Procedure:                Colonoscopy Indications:              Epigastric abdominal pain, Generalized abdominal                            pain, Family history of inflammatory bowel disease                            in a distant relative, Change in bowel habits,                            Weight loss                           23 yo female with family history of Celiac Disease                            (sister) and IBD (uncles x2) presents with change                            in bowel habits, generalized and MEG pain, nausea,                            weight loss, bloating and recent labs notable for                            elevated ESR, CRP and WBC (11). Medicines:                Monitored Anesthesia Care Procedure:                Pre-Anesthesia Assessment:                           - Prior to the procedure, a History and Physical                            was performed, and patient medications and                            allergies were reviewed. The patient's tolerance of                            previous anesthesia was also reviewed. The risks                            and benefits of the procedure and the sedation                            options and risks were discussed with the patient.  All questions were answered, and informed consent                            was obtained. Prior Anticoagulants: The patient has                            taken no previous anticoagulant or antiplatelet                            agents. ASA Grade Assessment: II - A patient with                            mild systemic disease. After reviewing the risks                            and benefits, the patient was deemed in             satisfactory condition to undergo the procedure.                           After obtaining informed consent, the colonoscope                            was passed under direct vision. Throughout the                            procedure, the patient's blood pressure, pulse, and                            oxygen saturations were monitored continuously. The                            Colonoscope was introduced through the anus and                            advanced to the the terminal ileum. The colonoscopy                            was performed without difficulty. The patient                            tolerated the procedure well. The quality of the                            bowel preparation was adequate. The terminal ileum,                            ileocecal valve, appendiceal orifice, and rectum                            were photographed. Scope In: 1:56:11 PM Scope Out: 2:04:43 PM Scope Withdrawal Time: 0 hours 6 minutes 55 seconds  Total Procedure Duration: 0 hours 8 minutes 32 seconds  Findings:  The perianal and digital rectal examinations were                            normal.                           The colon (entire examined portion) appeared                            normal. Biopsies for histology were taken with a                            cold forceps from the right colon and left colon                            for evaluation of microscopic colitis. Estimated                            blood loss was minimal.                           Retroflexion in the right colon was performed.                           The retroflexed view of the distal rectum and anal                            verge was normal and showed no anal or rectal                            abnormalities.                           The terminal ileum appeared normal. Complications:            No immediate complications. Estimated Blood Loss:     Estimated blood loss was  minimal. Impression:               - The entire examined colon is normal. Biopsied.                           - The distal rectum and anal verge are normal on                            retroflexion view.                           - The examined portion of the ileum was normal. Recommendation:           - Patient has a contact number available for                            emergencies. The signs and symptoms of potential                            delayed complications were discussed  with the                            patient. Return to normal activities tomorrow.                            Written discharge instructions were provided to the                            patient.                           - Resume previous diet.                           - Continue present medications.                           - Await pathology results.                           - Repeat colonoscopy at age 50 for screening                            purposes.                           - Return to GI office in 2-3 months. Gerrit Heck, MD 01/20/2019 2:13:36 PM

## 2019-01-22 ENCOUNTER — Telehealth: Payer: Self-pay | Admitting: *Deleted

## 2019-01-22 ENCOUNTER — Telehealth: Payer: Self-pay

## 2019-01-22 NOTE — Telephone Encounter (Signed)
Called [phone] and left a messaged we tried to reach pt for a follow up call. maw

## 2019-01-22 NOTE — Telephone Encounter (Signed)
  Follow up Call-  Call back number 01/20/2019  Post procedure Call Back phone  # [phone]  Permission to leave phone message Yes  Some recent data might be hidden     Patient questions:  Do you have a fever, pain , or abdominal swelling? No. Pain Score  0 *  Have you tolerated food without any problems? Yes.    Have you been able to return to your normal activities? Yes.    Do you have any questions about your discharge instructions: Diet   No. Medications  No. Follow up visit  No.  Do you have questions or concerns about your Care? No.  Actions: * If pain score is 4 or above: No action needed, pain <4. 1. Have you developed a fever since your procedure? no  2.   Have you had an respiratory symptoms (SOB or cough) since your procedure? no  3.   Have you tested positive for COVID 19 since your procedure no  4.   Have you had any family members/close contacts diagnosed with the COVID 19 since your procedure?  no   If yes to any of these questions please route to Joylene John, RN and Alphonsa Gin, Therapist, sports.

## 2019-01-27 ENCOUNTER — Encounter: Payer: Self-pay | Admitting: Gastroenterology

## 2019-01-29 NOTE — Telephone Encounter (Signed)
called and spoke with patient-patient informed of results from colonoscopy-patho results-patient is requesting to know the next step in her plan of care since she is still having symptoms; please advise

## 2019-01-29 NOTE — Telephone Encounter (Signed)
Called and spoke with patient-patient informed of MD recommendations; patient is agreeable with plan of care and has been scheduled at the [facility] office on 01/31/2019 at 8:40 am; patient is aware that appt is at [facility] office; Patient verbalized understanding of information/instructions;  Patient was advised to call the office at [phone] if questions/concerns arise;

## 2019-01-30 ENCOUNTER — Encounter: Payer: Self-pay | Admitting: *Deleted

## 2019-01-31 ENCOUNTER — Ambulatory Visit (INDEPENDENT_AMBULATORY_CARE_PROVIDER_SITE_OTHER): Payer: Self-pay | Admitting: Gastroenterology

## 2019-01-31 ENCOUNTER — Other Ambulatory Visit: Payer: Self-pay

## 2019-01-31 ENCOUNTER — Encounter: Payer: Self-pay | Admitting: Gastroenterology

## 2019-01-31 VITALS — BP 100/60 | HR 84 | Temp 97.9°F | Ht 65.0 in | Wt 242.2 lb

## 2019-01-31 DIAGNOSIS — D509 Iron deficiency anemia, unspecified: Secondary | ICD-10-CM

## 2019-01-31 DIAGNOSIS — R140 Abdominal distension (gaseous): Secondary | ICD-10-CM

## 2019-01-31 DIAGNOSIS — R1084 Generalized abdominal pain: Secondary | ICD-10-CM

## 2019-01-31 DIAGNOSIS — E559 Vitamin D deficiency, unspecified: Secondary | ICD-10-CM

## 2019-01-31 NOTE — Progress Notes (Signed)
P  Chief Complaint:    Abdominal pain, nausea  GI History: 23 year old female with generalized abdominal pain, constipation, nausea without emesis, abdominal bloating.  Abd pain and bloating started in May. Thought it was stress related (job stress; has since left that job). Also with mid abdominal burning pain. No improvement with OTC meds/household remedies.  Unrevealing evaluation to date to include abdominal x-ray (constipation, although does not endorse clinical constipation).  Labs in 11/2018: Elevated CRP/ESR (32/66), WBC 11, normal H/H but MCV 70 (historically high 60s/low 70s and hemoglobin 10's).  Normal CMP.  Interestingly had elevated ESR/CRP in 2019 as well.  Celiac panel negative.  Was seen in [facility] in 12/2018 with reportedly normal CT (study not seen in EMR). CT in 2019 demonstrating mildly distended fluid-filled loops of the small/large bowel, suspicious for enteritis.  EGD and colonoscopy largely normal.  Normal B12, folate.  Low vitamin D (25), ferritin (23), iron (32), iron sat (8.8%); started on oral iron supplementation and ergocalciferol and OTC vitamin D.  For the constipation noted on x-ray, trialed course of MiraLAX x2 to 3 weeks with some BMs, but not regular bowel habits.  Then trialed mag oxide with worsening of symptoms and straining to have BM with pellet-like stools.  No hematochezia.  Trialed OTC Dulcolax and Epsom salt without change.  Currently having 1 BM every 3-4 days.  Drinks plenty of water, increase dietary fiber.  Family history: Paternal uncle with Crohn's disease and diverticulitis Paternal uncle with ulcerative colitis Father with diverticulitis Sister with IBS and Celiac Disease Mother with MS Maternal aunt with SLE  Endoscopic history: - EGD (01/2019, Dr. Bryan Lemma): Normal, with normal duodenal biopsies.  Mild gastritis biopsies without H. pylori -Colonoscopy (01/2019, Dr. Bryan Lemma): Normal.  Repeat age 56.  HPI:    Patient is a 23 y.o.  female presenting to the [facility]enterology Clinic for follow-up. Overall improvement, but still with some abdominal pain and bloating. Still only eating small meals and sxs impacting daily life. Bowel habits have returned to normal.   Tolerating Ergocal and iron supplement without issue.   Mother with MS, so she is increasingly concerned for alternate, non-primary GI etiology for sxs.    Review of systems:     No chest pain, no SOB, no fevers, no urinary sx   Past Medical History:  Diagnosis Date  . Anemia   . Anxiety   . Asthma   . Depression   . Fibromyalgia   . Gastritis   . Iron deficiency   . PTSD (post-traumatic stress disorder)   . Vitamin D insufficiency     Patient's surgical history, family medical history, social history, medications and allergies were all reviewed in Epic    Current Outpatient Medications  Medication Sig Dispense Refill  . albuterol (PROVENTIL HFA;VENTOLIN HFA) 108 (90 Base) MCG/ACT inhaler Inhale 2 puffs into the lungs every 6 (six) hours as needed for wheezing or shortness of breath. 1 Inhaler 0  . Atomoxetine HCl (STRATTERA PO) Take 1 tablet by mouth daily.    . BuPROPion HCl (WELLBUTRIN PO) Take 150 mg by mouth daily.     . fluticasone (FLONASE) 50 MCG/ACT nasal spray Place 1 spray into both nostrils 2 (two) times daily. 16 g 6  . gabapentin (NEURONTIN) 100 MG capsule TAKE 1 CAPSULE (100 MG TOTAL) BY MOUTH DAILY AFTER LUNCH AND 3 CAPSULES (300 MG TOTAL) AT BEDTIME. 360 capsule 1  . levonorgestrel (MIRENA) 20 MCG/24HR IUD 1 each by Intrauterine route once.  Placed by IUD     No current facility-administered medications for this visit.     Physical Exam:     BP 100/60   Pulse 84   Temp 97.9 F (36.6 C)   Ht 5' 5"  (1.651 m)   Wt 242 lb 3.2 oz (109.9 kg)   BMI 40.30 kg/m   GENERAL:  Pleasant female in NAD PSYCH: : Cooperative, normal affect EENT:  conjunctiva pink, mucous membranes moist, neck supple without masses CARDIAC:  RRR, no  murmur heard, no peripheral edema PULM: Normal respiratory effort, lungs CTA bilaterally, no wheezing ABDOMEN:  Nondistended, soft, nontender. No obvious masses, no hepatomegaly,  normal bowel sounds SKIN:  turgor, no lesions seen Musculoskeletal:  Normal muscle tone, normal strength NEURO: Alert and oriented x 3, no focal neurologic deficits   IMPRESSION and PLAN:    1) Abdominal pain 2) Nausea without vomiting- resolved 3) Iron deficiency 4) Vitamin D insufficiency 5) Change in bowel habits- resolved 6) Bloating  - Trial course of FDGuard - VCE to ensure no additional small bowel pathology as etiology for iron def and Vit D insufficiency - Repeat CBC, iron panel, Vit D in 3 months - Low FODMAP diet for suspected component of IBS -Depending on results of VCE and response to therapy, can consider hydrogen breath test for possible SIBO, although typically expect a B12 deficiency  - RTC in 3-4 months or sooner as needed          Lavena Bullion ,DO, FACG 01/31/2019, 9:21 AM

## 2019-01-31 NOTE — Patient Instructions (Signed)
We have given you samples of the following medication to take: FD Gard as per box instructions  Please follow up with Dr Bryan Lemma in 3 months.    [facility] CAPSULE ENDOSCOPY PATIENT INSTRUCTION SHEET  Baylin Cabal 1996-04-29 [id number]   1. 02/06/2019 Seven (7) days prior to capsule endoscopy stop taking iron supplements and carafate.  2. 02/11/2019 Two (2) days prior to capsule endoscopy stop taking aspirin or any arthritis drugs.  3. 02/12/2019 Day before capsule endoscopy purchase a 238 gram bottle of Miralax from the laxative section of your drug store, and a 32 oz. bottle of Gatorade (no red).    4. 02/12/2019 One (1) day prior to capsule endoscopy: a) Stop smoking. b) Eat a regular diet until 12:00 Noon. c) After 12:00 Noon take only the following: Black coffee  Jell-O (no fruit or red Jell-o) Water   Bouillon (chicken or beef) 7-Up   Cranberry Juice Tea   Kool-Aid Popsicle (not red) Sprite   Coke Ginger Ale  Pepsi Mountain Dew Gatorade d) At 6:00 pm the evening before your appointment, drink 7 capfuls (105 grams) of Miralax with 32 oz. Gatorade. Drink 8 oz every 15 minutes until gone. e) Nothing to eat or drink after midnight except medications with a sip of water.  5. 02/13/2019 Day of capsule endoscopy:            Do not have anything to eat or drink after midnight No medications for 2 hours prior to your test.  6. Please arrive at [facility]  3rd floor patient registration area by 8:30 am on: 02/13/19.   For any questions: Call [facility] at [phone] and ask to speak with one of the capsule endoscopy nurses.      The above instructions have been reviewed and explained to me by________________   Patient signature:_________________________________________     Date:________________        What you should know.   You have been scheduled for a Capsule Endoscopy with Capsocam.  You will swallow a vitamin size pill that contains cameras that  will take pictures of your small intestine (bowel).  Your small bowel connects to your stomach on one end and the large intestine or bowel on the other. The capsule moves through the gastrointestinal tract taking pictures along the way. The images are stored on the capsule.  1-5 days after the capsule ingestion you will retrieve the capsule and mail it in a prepaid envelope to [facility] to download the images.  Your physician will be provided a copy of the images to review.   Who should have a capsule endoscopy?  You may need a small bowel capsule endoscopy if you have symptoms, such as blood in your stool, chronic pain, diarrhea, or unexplained anemia. The small intestine is a hollow organ that cannot be easily visualized with a scope due to its length.  The capsule endoscopy allows your physician to directly visualize the intestine.  The pictures may show intestine growths, inflammation, or bleeding in the small intestine.   What are the risks with a capsule endoscopy?  The pictures may not be clear or give [place] a clear cause of your symptoms The capsule may become trapped in the esophagus or intestines.  You may need surgery or endoscopic procedures to remove the capsule. You may not have a MRI until the capsule endoscopy has been retrieved.   How do I retrieve the capsule?  You will be provided a kit with step-by-step instructions for capsule  retrieval and mailing.  You should expect to retrieve the capsule in 1-5 days after ingestion.  This will depend on your individual bowel habits.    If you are age 25 or older, your body mass index should be between 23-30. Your Body mass index is 40.3 kg/m. If this is out of the aforementioned range listed, please consider follow up with your Primary Care Provider.  If you are age 6 or younger, your body mass index should be between 19-25. Your Body mass index is 40.3 kg/m. If this is out of the aformentioned range listed, please consider follow up  with your Primary Care Provider.

## 2019-02-13 ENCOUNTER — Encounter: Payer: Self-pay | Admitting: Gastroenterology

## 2019-02-13 ENCOUNTER — Ambulatory Visit (INDEPENDENT_AMBULATORY_CARE_PROVIDER_SITE_OTHER): Payer: 59 | Admitting: Gastroenterology

## 2019-02-13 ENCOUNTER — Other Ambulatory Visit: Payer: Self-pay

## 2019-02-13 DIAGNOSIS — R7982 Elevated C-reactive protein (CRP): Secondary | ICD-10-CM

## 2019-02-13 DIAGNOSIS — R194 Change in bowel habit: Secondary | ICD-10-CM

## 2019-02-13 DIAGNOSIS — R634 Abnormal weight loss: Secondary | ICD-10-CM

## 2019-02-13 DIAGNOSIS — R140 Abdominal distension (gaseous): Secondary | ICD-10-CM

## 2019-02-13 DIAGNOSIS — R110 Nausea: Secondary | ICD-10-CM

## 2019-02-13 DIAGNOSIS — E559 Vitamin D deficiency, unspecified: Secondary | ICD-10-CM

## 2019-02-13 DIAGNOSIS — D509 Iron deficiency anemia, unspecified: Secondary | ICD-10-CM

## 2019-02-13 DIAGNOSIS — R1084 Generalized abdominal pain: Secondary | ICD-10-CM

## 2019-02-13 NOTE — Patient Instructions (Signed)

## 2019-02-13 NOTE — Progress Notes (Signed)
Patient here for capsule endoscopy.  Patient tolerated well Capsule ID [id number] Exp 03/21/2020 Lot # [id number]

## 2019-02-18 NOTE — Progress Notes (Signed)
Capsule study completed and notable for the following:  -Complete capsule study with adequate prep. -Normal study.  There was no areas of active bleeding or stigmata of recent bleeding. -Small bowel transit time 2 hours 50 minutes with good images.  Recommendations: -Repeat hemoglobin and iron panel in 3 months -Follow-up with me in the GI clinic

## 2019-02-19 ENCOUNTER — Telehealth: Payer: Self-pay

## 2019-02-19 NOTE — Telephone Encounter (Signed)
Left message for patient to call back to the office;  

## 2019-02-19 NOTE — Telephone Encounter (Signed)
-----   Message from Story, DO sent at 02/18/2019  4:09 PM EDT ----- Capsule study completed and notable for the following:  -Complete capsule study with adequate prep. -Normal study.  There was no areas of active bleeding or stigmata of recent bleeding. -Small bowel transit time 2 hours 50 minutes with good images.  Recommendations: -Repeat hemoglobin and iron panel in 3 months -Follow-up with me in the GI clinic

## 2019-02-20 NOTE — Telephone Encounter (Signed)
Patient is returning your phone call. 

## 2019-02-25 NOTE — Telephone Encounter (Signed)
Unable to reach patient as number rings busy-will attempt to reach patient at a later date/time;

## 2019-02-25 NOTE — Telephone Encounter (Signed)
Patient returned call to the office- patient was informed of MD recommendations following results of capsule endoscopy-patient is agreeable with plan of care and has been scheduled for a f/u OV on 03/25/2019 at 1:20 pm; Patient advised to call back to the office at [phone] should questions/concerns arise;  Patient verbalized understanding of information/instructions;

## 2019-03-25 ENCOUNTER — Ambulatory Visit: Payer: 59 | Admitting: Gastroenterology

## 2019-06-05 ENCOUNTER — Telehealth: Payer: Self-pay

## 2019-06-05 DIAGNOSIS — D509 Iron deficiency anemia, unspecified: Secondary | ICD-10-CM

## 2019-06-05 DIAGNOSIS — E559 Vitamin D deficiency, unspecified: Secondary | ICD-10-CM

## 2019-06-05 NOTE — Telephone Encounter (Signed)
Left message for patient to call back to the office;  Lab orders placed in Epic;

## 2019-06-05 NOTE — Telephone Encounter (Signed)
-----   Message from Johnney Killian, RN sent at 02/25/2019  4:38 PM EDT ----- Regarding: repeat lab work Please notify patient of repeat lab work (hemoglobin and iron panel) to be completed in 3 months (from 02/25/2019) Thank you Bre

## 2019-06-06 NOTE — Telephone Encounter (Signed)
Called and spoke with patient-patient informed of need for repeat lab work and is agreeable to plan of care and will complete lab work as able; Patient advised to call back to the office at [phone] should questions/concerns arise;  Patient verbalized understanding of information/instructions;

## 2019-07-13 ENCOUNTER — Other Ambulatory Visit: Payer: Self-pay

## 2019-07-13 DIAGNOSIS — Z20822 Contact with and (suspected) exposure to covid-19: Secondary | ICD-10-CM

## 2019-07-14 LAB — NOVEL CORONAVIRUS, NAA: SARS-CoV-2, NAA: NOT DETECTED

## 2019-07-29 DIAGNOSIS — F4312 Post-traumatic stress disorder, chronic: Secondary | ICD-10-CM | POA: Diagnosis not present

## 2019-07-29 DIAGNOSIS — R41840 Attention and concentration deficit: Secondary | ICD-10-CM | POA: Diagnosis not present

## 2019-07-29 DIAGNOSIS — F331 Major depressive disorder, recurrent, moderate: Secondary | ICD-10-CM | POA: Diagnosis not present

## 2019-08-01 DIAGNOSIS — F4312 Post-traumatic stress disorder, chronic: Secondary | ICD-10-CM | POA: Diagnosis not present

## 2019-08-01 DIAGNOSIS — R41840 Attention and concentration deficit: Secondary | ICD-10-CM | POA: Diagnosis not present

## 2019-08-01 DIAGNOSIS — F331 Major depressive disorder, recurrent, moderate: Secondary | ICD-10-CM | POA: Diagnosis not present

## 2019-09-02 DIAGNOSIS — F4312 Post-traumatic stress disorder, chronic: Secondary | ICD-10-CM | POA: Diagnosis not present

## 2019-09-02 DIAGNOSIS — F331 Major depressive disorder, recurrent, moderate: Secondary | ICD-10-CM | POA: Diagnosis not present

## 2019-09-02 DIAGNOSIS — R41840 Attention and concentration deficit: Secondary | ICD-10-CM | POA: Diagnosis not present

## 2019-09-30 DIAGNOSIS — F4312 Post-traumatic stress disorder, chronic: Secondary | ICD-10-CM | POA: Diagnosis not present

## 2019-09-30 DIAGNOSIS — R41840 Attention and concentration deficit: Secondary | ICD-10-CM | POA: Diagnosis not present

## 2019-09-30 DIAGNOSIS — F331 Major depressive disorder, recurrent, moderate: Secondary | ICD-10-CM | POA: Diagnosis not present

## 2019-10-07 ENCOUNTER — Emergency Department (HOSPITAL_COMMUNITY): Payer: BC Managed Care – PPO

## 2019-10-07 ENCOUNTER — Emergency Department (HOSPITAL_COMMUNITY): Payer: BC Managed Care – PPO | Admitting: Anesthesiology

## 2019-10-07 ENCOUNTER — Encounter (HOSPITAL_COMMUNITY): Payer: Self-pay

## 2019-10-07 ENCOUNTER — Ambulatory Visit (HOSPITAL_COMMUNITY)
Admission: EM | Admit: 2019-10-07 | Discharge: 2019-10-07 | Disposition: A | Discharge status: Home / Self Care | Payer: BC Managed Care – PPO | Attending: Gastroenterology | Admitting: Gastroenterology

## 2019-10-07 ENCOUNTER — Encounter (HOSPITAL_COMMUNITY)
Admission: EM | Disposition: A | Discharge status: Home / Self Care | Payer: Self-pay | Source: Home / Self Care | Attending: Emergency Medicine

## 2019-10-07 DIAGNOSIS — Z8379 Family history of other diseases of the digestive system: Secondary | ICD-10-CM | POA: Diagnosis not present

## 2019-10-07 DIAGNOSIS — Z820 Family history of epilepsy and other diseases of the nervous system: Secondary | ICD-10-CM | POA: Insufficient documentation

## 2019-10-07 DIAGNOSIS — Z888 Allergy status to other drugs, medicaments and biological substances status: Secondary | ICD-10-CM | POA: Insufficient documentation

## 2019-10-07 DIAGNOSIS — D509 Iron deficiency anemia, unspecified: Secondary | ICD-10-CM | POA: Diagnosis not present

## 2019-10-07 DIAGNOSIS — Z8489 Family history of other specified conditions: Secondary | ICD-10-CM | POA: Insufficient documentation

## 2019-10-07 DIAGNOSIS — Z79899 Other long term (current) drug therapy: Secondary | ICD-10-CM | POA: Insufficient documentation

## 2019-10-07 DIAGNOSIS — F418 Other specified anxiety disorders: Secondary | ICD-10-CM | POA: Diagnosis not present

## 2019-10-07 DIAGNOSIS — Z8049 Family history of malignant neoplasm of other genital organs: Secondary | ICD-10-CM | POA: Diagnosis not present

## 2019-10-07 DIAGNOSIS — R933 Abnormal findings on diagnostic imaging of other parts of digestive tract: Secondary | ICD-10-CM | POA: Diagnosis not present

## 2019-10-07 DIAGNOSIS — F4310 Post-traumatic stress disorder, unspecified: Secondary | ICD-10-CM | POA: Insufficient documentation

## 2019-10-07 DIAGNOSIS — Z20822 Contact with and (suspected) exposure to covid-19: Secondary | ICD-10-CM | POA: Diagnosis not present

## 2019-10-07 DIAGNOSIS — Z8371 Family history of colonic polyps: Secondary | ICD-10-CM | POA: Insufficient documentation

## 2019-10-07 DIAGNOSIS — T185XXA Foreign body in anus and rectum, initial encounter: Secondary | ICD-10-CM | POA: Insufficient documentation

## 2019-10-07 DIAGNOSIS — E559 Vitamin D deficiency, unspecified: Secondary | ICD-10-CM | POA: Diagnosis not present

## 2019-10-07 DIAGNOSIS — M797 Fibromyalgia: Secondary | ICD-10-CM | POA: Diagnosis not present

## 2019-10-07 DIAGNOSIS — J45909 Unspecified asthma, uncomplicated: Secondary | ICD-10-CM | POA: Diagnosis not present

## 2019-10-07 DIAGNOSIS — F329 Major depressive disorder, single episode, unspecified: Secondary | ICD-10-CM | POA: Diagnosis not present

## 2019-10-07 DIAGNOSIS — X58XXXA Exposure to other specified factors, initial encounter: Secondary | ICD-10-CM | POA: Insufficient documentation

## 2019-10-07 DIAGNOSIS — Z801 Family history of malignant neoplasm of trachea, bronchus and lung: Secondary | ICD-10-CM | POA: Insufficient documentation

## 2019-10-07 DIAGNOSIS — F419 Anxiety disorder, unspecified: Secondary | ICD-10-CM | POA: Diagnosis not present

## 2019-10-07 DIAGNOSIS — E669 Obesity, unspecified: Secondary | ICD-10-CM | POA: Diagnosis not present

## 2019-10-07 DIAGNOSIS — Z8249 Family history of ischemic heart disease and other diseases of the circulatory system: Secondary | ICD-10-CM | POA: Diagnosis not present

## 2019-10-07 DIAGNOSIS — Z03818 Encounter for observation for suspected exposure to other biological agents ruled out: Secondary | ICD-10-CM | POA: Diagnosis not present

## 2019-10-07 DIAGNOSIS — Z833 Family history of diabetes mellitus: Secondary | ICD-10-CM | POA: Insufficient documentation

## 2019-10-07 DIAGNOSIS — Z6841 Body Mass Index (BMI) 40.0 and over, adult: Secondary | ICD-10-CM | POA: Insufficient documentation

## 2019-10-07 HISTORY — PX: FLEXIBLE SIGMOIDOSCOPY: SHX5431

## 2019-10-07 HISTORY — PX: FOREIGN BODY REMOVAL: SHX962

## 2019-10-07 LAB — CBC WITH DIFFERENTIAL/PLATELET
Abs Immature Granulocytes: 0.03 10*3/uL (ref 0.00–0.07)
Basophils Absolute: 0.1 10*3/uL (ref 0.0–0.1)
Basophils Relative: 1 %
Eosinophils Absolute: 0.5 10*3/uL (ref 0.0–0.5)
Eosinophils Relative: 5 %
HCT: 38.5 % (ref 36.0–46.0)
Hemoglobin: 12.2 g/dL (ref 12.0–15.0)
Immature Granulocytes: 0 %
Lymphocytes Relative: 26 %
Lymphs Abs: 2.6 10*3/uL (ref 0.7–4.0)
MCH: 23.9 pg — ABNORMAL LOW (ref 26.0–34.0)
MCHC: 31.7 g/dL (ref 30.0–36.0)
MCV: 75.3 fL — ABNORMAL LOW (ref 80.0–100.0)
Monocytes Absolute: 0.6 10*3/uL (ref 0.1–1.0)
Monocytes Relative: 5 %
Neutro Abs: 6.4 10*3/uL (ref 1.7–7.7)
Neutrophils Relative %: 63 %
Platelets: 477 10*3/uL — ABNORMAL HIGH (ref 150–400)
RBC: 5.11 MIL/uL (ref 3.87–5.11)
RDW: 14.5 % (ref 11.5–15.5)
WBC: 10.1 10*3/uL (ref 4.0–10.5)
nRBC: 0 % (ref 0.0–0.2)

## 2019-10-07 LAB — I-STAT BETA HCG BLOOD, ED (MC, WL, AP ONLY): I-stat hCG, quantitative: <5 m[IU]/mL (ref <5)

## 2019-10-07 LAB — SARS CORONAVIRUS 2 BY RT PCR (HOSPITAL ORDER, PERFORMED IN ~~LOC~~ HOSPITAL LAB): SARS Coronavirus 2: NEGATIVE

## 2019-10-07 LAB — BASIC METABOLIC PANEL
Anion gap: 11 (ref 5–15)
BUN: 11 mg/dL (ref 6–20)
CO2: 23 mmol/L (ref 22–32)
Calcium: 9 mg/dL (ref 8.9–10.3)
Chloride: 105 mmol/L (ref 98–111)
Creatinine, Ser: 0.6 mg/dL (ref 0.44–1.00)
GFR calc Af Amer: >60 mL/min (ref >60)
GFR calc non Af Amer: >60 mL/min (ref >60)
Glucose, Bld: 107 mg/dL — ABNORMAL HIGH (ref 70–99)
Potassium: 4.5 mmol/L (ref 3.5–5.1)
Sodium: 139 mmol/L (ref 135–145)

## 2019-10-07 SURGERY — SIGMOIDOSCOPY, FLEXIBLE
Anesthesia: Monitor Anesthesia Care

## 2019-10-07 MED ORDER — PROPOFOL 500 MG/50ML IV EMUL
INTRAVENOUS | Status: AC
  Filled 2019-10-07: qty 50

## 2019-10-07 MED ORDER — LIDOCAINE 2% (20 MG/ML) 5 ML SYRINGE
INTRAMUSCULAR | Status: DC | PRN
  Administered 2019-10-07: 100 mg via INTRAVENOUS

## 2019-10-07 MED ORDER — PROPOFOL 10 MG/ML IV BOLUS
INTRAVENOUS | Status: AC
  Filled 2019-10-07: qty 20

## 2019-10-07 MED ORDER — ONDANSETRON HCL 4 MG/2ML IJ SOLN
INTRAMUSCULAR | Status: DC | PRN
  Administered 2019-10-07: 4 mg via INTRAVENOUS

## 2019-10-07 MED ORDER — PROPOFOL 10 MG/ML IV BOLUS
INTRAVENOUS | Status: DC | PRN
  Administered 2019-10-07 (×3): 20 mg via INTRAVENOUS

## 2019-10-07 MED ORDER — FENTANYL CITRATE (PF) 100 MCG/2ML IJ SOLN
50.0000 ug | Freq: Once | INTRAMUSCULAR | Status: AC
  Administered 2019-10-07: 50 ug via INTRAVENOUS
  Filled 2019-10-07: qty 2

## 2019-10-07 MED ORDER — ONDANSETRON HCL 4 MG/2ML IJ SOLN
4.0000 mg | Freq: Once | INTRAMUSCULAR | Status: DC
  Filled 2019-10-07: qty 2

## 2019-10-07 MED ORDER — LACTATED RINGERS IV SOLN
INTRAVENOUS | Status: DC
  Administered 2019-10-07: 1000 mL via INTRAVENOUS

## 2019-10-07 MED ORDER — FENTANYL CITRATE (PF) 100 MCG/2ML IJ SOLN
INTRAMUSCULAR | Status: DC | PRN
  Administered 2019-10-07 (×2): 50 ug via INTRAVENOUS

## 2019-10-07 MED ORDER — FENTANYL CITRATE (PF) 100 MCG/2ML IJ SOLN
INTRAMUSCULAR | Status: AC
  Filled 2019-10-07: qty 2

## 2019-10-07 MED ORDER — LORAZEPAM 2 MG/ML IJ SOLN
0.5000 mg | Freq: Once | INTRAMUSCULAR | Status: AC
  Administered 2019-10-07: 0.5 mg via INTRAVENOUS
  Filled 2019-10-07: qty 1

## 2019-10-07 MED ORDER — SODIUM CHLORIDE 0.9 % IV SOLN: INTRAVENOUS | Status: DC

## 2019-10-07 MED ORDER — PROPOFOL 500 MG/50ML IV EMUL
INTRAVENOUS | Status: DC | PRN
  Administered 2019-10-07: 125 ug/kg/min via INTRAVENOUS

## 2019-10-07 MED ORDER — LIDOCAINE (ANORECTAL) 5 % EX CREA: 1.0000 "application " | TOPICAL_CREAM | Freq: Three times a day (TID) | CUTANEOUS | 0 refills | Status: AC | PRN

## 2019-10-07 NOTE — ED Provider Notes (Signed)
[facility]-EMERGENCY DEPT Provider Note   CSN: [id number] Arrival date & time: 10/07/19  0347     History Chief Complaint  Patient presents with  . Foreign Body    Wanda Garrett is a 24 y.o. female.  Patient to ED reporting rectal foreign body. She and her husband were using a "butt plug" during sexual intercourse and the device went far into the rectum. No bleeding. She reports some peri-rectal cramping but no abdominal or rectal pain.   The history is provided by the patient. No language interpreter was used.  Foreign Body Associated symptoms: rectal pain   Associated symptoms: no abdominal pain and no nausea        Past Medical History:  Diagnosis Date  . Anemia   . Anxiety   . Asthma   . Depression   . Fibromyalgia   . Gastritis   . Iron deficiency   . PTSD (post-traumatic stress disorder)   . Vitamin D insufficiency     Patient Active Problem List   Diagnosis Date Noted  . Fibromyalgia 04/29/2018  . Vitamin D deficiency 12/26/2017  . PTSD (post-traumatic stress disorder) 12/07/2017  . Anxiety and depression 12/07/2017  . History of anemia 12/07/2017  . History of asthma 12/07/2017  . Class 2 obesity with body mass index (BMI) of 39.0 to 39.9 in adult 11/16/2017    Past Surgical History:  Procedure Laterality Date  . NO PAST SURGERIES       OB History   No obstetric history on file.     Family History  Problem Relation Age of Onset  . Multiple sclerosis Mother   . Colon polyps Mother   . Sarcoidosis Mother   . Hyperlipidemia Father   . Hypertension Father   . Diverticulitis Father   . Other Father        cystic fibrosis carrier  . Celiac disease Sister   . Other Sister        cystic fibrosis carrier  . Diabetes Maternal Grandmother   . Uterine cancer Maternal Grandmother   . Lung cancer Paternal Grandmother   . Heart disease Paternal Grandfather   . Hyperlipidemia Paternal Grandfather   . Hypertension Paternal  Grandfather   . Crohn's disease Paternal Uncle   . Ulcerative colitis Paternal Uncle   . Lupus Maternal Aunt   . Cystic fibrosis Cousin   . Colon cancer Neg Hx   . Esophageal cancer Neg Hx   . Rectal cancer Neg Hx   . Stomach cancer Neg Hx   . Liver disease Neg Hx   . Pancreatic cancer Neg Hx     Social History   Tobacco Use  . Smoking status: Former Smoker    Types: Cigarettes    Quit date: 05/19/2016    Years since quitting: 3.3  . Smokeless tobacco: Never Used  Substance Use Topics  . Alcohol use: Yes    Alcohol/week: 3.0 standard drinks    Types: 3 Standard drinks or equivalent per week    Comment: occasional  . Drug use: Yes    Types: Marijuana    Home Medications Prior to Admission medications   Medication Sig Start Date End Date Taking? Authorizing Provider  albuterol (PROVENTIL HFA;VENTOLIN HFA) 108 (90 Base) MCG/ACT inhaler Inhale 2 puffs into the lungs every 6 (six) hours as needed for wheezing or shortness of breath. 07/29/18   Jannifer Rodney A, FNP  Atomoxetine HCl (STRATTERA PO) Take 1 tablet by mouth daily.  [provider]  BuPROPion HCl (WELLBUTRIN PO) Take 150 mg by mouth daily.     [provider]  fluticasone (FLONASE) 50 MCG/ACT nasal spray Place 1 spray into both nostrils 2 (two) times daily. 06/10/18   Rutherford Guys, MD  gabapentin (NEURONTIN) 100 MG capsule TAKE 1 CAPSULE (100 MG TOTAL) BY MOUTH DAILY AFTER LUNCH AND 3 CAPSULES (300 MG TOTAL) AT BEDTIME. 08/04/18   Rutherford Guys, MD  levonorgestrel (MIRENA) 20 MCG/24HR IUD 1 each by Intrauterine route once. Placed by IUD 07/03/18   [provider]    Allergies    Cymbalta [duloxetine hcl]  Review of Systems   Review of Systems  Constitutional: Negative for fever.  Respiratory: Negative for shortness of breath.   Cardiovascular: Negative for chest pain.  Gastrointestinal: Positive for rectal pain. Negative for abdominal pain, anal bleeding and nausea.  Genitourinary:  Negative for pelvic pain.    Physical Exam Updated Vital Signs BP (!) 146/129 (BP Location: Left Arm)   Pulse (!) 107   Temp 98.7 F (37.1 C) (Oral)   Resp 18   Ht 5\' 5"  (1.651 m)   Wt 117.9 kg   SpO2 98%   BMI 43.27 kg/m   Physical Exam Vitals and nursing note reviewed.  Constitutional:      Appearance: She is obese.  Pulmonary:     Effort: Pulmonary effort is normal.  Abdominal:     Tenderness: There is no abdominal tenderness.  Genitourinary:    Comments: No foreign body observed at the anus. Presumed rectal foreign body not palpable on digital exam. No bleeding. Neurological:     Mental Status: She is alert.     ED Results / Procedures / Treatments   Labs (all labs ordered are listed, but only abnormal results are displayed) Labs Reviewed  CBC WITH DIFFERENTIAL/PLATELET  BASIC METABOLIC PANEL  I-STAT BETA HCG BLOOD, ED ([facility], [facility], [facility] ONLY)    EKG None  Radiology No results found.  Procedures Procedures (including critical care time)  Medications Ordered in ED Medications  LORazepam (ATIVAN) injection 0.5 mg (has no administration in time range)    ED Course  I have reviewed the triage vital signs and the nursing notes.  Pertinent labs & imaging results that were available during my care of the patient were reviewed by me and considered in my medical decision making (see chart for details).    MDM Rules/Calculators/A&P                      Patient to ED with rectal foreign body, confirmed on imaging. No bleeding or significant pain.   Discussed patient's presentation with Dr. Luan Pulling (GI) who reports that day team GI will see her at 7:30/8:00 to take her to endo to remove the foreign body.   Patient and husband updated. All questions answered.  Final Clinical Impression(s) / ED Diagnoses Final diagnoses:  None   1. Rectal foreign body  Rx / DC Orders ED Discharge Orders    None       Charlann Lange, PA-C 10/07/19 1610    Merryl Hacker, MD 10/07/19 [phone]

## 2019-10-07 NOTE — ED Provider Notes (Signed)
Pt pending foreign body removal by GI.   Pt requesting pain medication, orders placed for fentanyl and zofran for nausea. Pt updated on plan.    Dewain Platz, [place] N, PA-C 10/07/19 4327    Shon Baton, MD 10/11/19 2258

## 2019-10-07 NOTE — Anesthesia Preprocedure Evaluation (Signed)
Anesthesia Evaluation  Patient identified by MRN, date of birth, ID band Patient awake    Reviewed: Allergy & Precautions, NPO status , Patient's Chart, lab work & pertinent test results  Airway Mallampati: III       Dental no notable dental hx. (+) Teeth Intact   Pulmonary asthma , former smoker,    Pulmonary exam normal breath sounds clear to auscultation       Cardiovascular negative cardio ROS Normal cardiovascular exam Rhythm:Regular Rate:Normal     Neuro/Psych PSYCHIATRIC DISORDERS Anxiety Depression    GI/Hepatic negative GI ROS, Neg liver ROS,   Endo/Other  Morbid obesity  Renal/GU negative Renal ROS     Musculoskeletal   Abdominal (+) + obese,   Peds  Hematology   Anesthesia Other Findings   Reproductive/Obstetrics                             Anesthesia Physical Anesthesia Plan  ASA: III  Anesthesia Plan: MAC   Post-op Pain Management:    Induction:   PONV Risk Score and Plan: Propofol infusion and TIVA  Airway Management Planned: Nasal Cannula, Natural Airway and Simple Face Mask  Additional Equipment: None  Intra-op Plan:   Post-operative Plan:   Informed Consent: I have reviewed the patients History and Physical, chart, labs and discussed the procedure including the risks, benefits and alternatives for the proposed anesthesia with the patient or authorized representative who has indicated his/her understanding and acceptance.       Plan Discussed with: CRNA  Anesthesia Plan Comments:         Anesthesia Quick Evaluation

## 2019-10-07 NOTE — Consult Note (Addendum)
Referring Provider:  EDP Primary Care Physician:  [name], MD Primary Gastroenterologist:  Dr. Barron Alvine  Reason for Consultation:  Rectal foreign body  HPI: Wanda Garrett is a 24 y.o. female with limited past medical history who presents to [facility] long ED with complaints of rectal foreign body. She says that during intercourse they were using a "butt plug" that was inserted too far and became lodged around 3 AM today. She reports some abdominal cramping and pain in her lower back. Abdominal x-ray shows an 8.6 cm rectal foreign body.   Past Medical History:  Diagnosis Date  . Anemia   . Anxiety   . Asthma   . Depression   . Fibromyalgia   . Gastritis   . Iron deficiency   . PTSD (post-traumatic stress disorder)   . Vitamin D insufficiency     Past Surgical History:  Procedure Laterality Date  . NO PAST SURGERIES      Prior to Admission medications   Medication Sig Start Date End Date Taking? Authorizing Provider  albuterol (PROVENTIL HFA;VENTOLIN HFA) 108 (90 Base) MCG/ACT inhaler Inhale 2 puffs into the lungs every 6 (six) hours as needed for wheezing or shortness of breath. 07/29/18   Jannifer Rodney A, FNP  Atomoxetine HCl (STRATTERA PO) Take 1 tablet by mouth daily.    [provider]  BuPROPion HCl (WELLBUTRIN PO) Take 150 mg by mouth daily.     [provider]  fluticasone (FLONASE) 50 MCG/ACT nasal spray Place 1 spray into both nostrils 2 (two) times daily. 06/10/18   [name], MD  gabapentin (NEURONTIN) 100 MG capsule TAKE 1 CAPSULE (100 MG TOTAL) BY MOUTH DAILY AFTER LUNCH AND 3 CAPSULES (300 MG TOTAL) AT BEDTIME. 08/04/18   [name], MD  levonorgestrel (MIRENA) 20 MCG/24HR IUD 1 each by Intrauterine route once. Placed by IUD 07/03/18   [provider]    No current facility-administered medications for this encounter.   Current Outpatient Medications  Medication Sig Dispense Refill  . albuterol (PROVENTIL  HFA;VENTOLIN HFA) 108 (90 Base) MCG/ACT inhaler Inhale 2 puffs into the lungs every 6 (six) hours as needed for wheezing or shortness of breath. 1 Inhaler 0  . Atomoxetine HCl (STRATTERA PO) Take 1 tablet by mouth daily.    . BuPROPion HCl (WELLBUTRIN PO) Take 150 mg by mouth daily.     . fluticasone (FLONASE) 50 MCG/ACT nasal spray Place 1 spray into both nostrils 2 (two) times daily. 16 g 6  . gabapentin (NEURONTIN) 100 MG capsule TAKE 1 CAPSULE (100 MG TOTAL) BY MOUTH DAILY AFTER LUNCH AND 3 CAPSULES (300 MG TOTAL) AT BEDTIME. 360 capsule 1  . levonorgestrel (MIRENA) 20 MCG/24HR IUD 1 each by Intrauterine route once. Placed by IUD      Allergies as of 10/07/2019 - Review Complete 01/31/2019  Allergen Reaction Noted  . Cymbalta [duloxetine hcl] Nausea Only 06/10/2018    Family History  Problem Relation Age of Onset  . Multiple sclerosis Mother   . Colon polyps Mother   . Sarcoidosis Mother   . Hyperlipidemia Father   . Hypertension Father   . Diverticulitis Father   . Other Father        cystic fibrosis carrier  . Celiac disease Sister   . Other Sister        cystic fibrosis carrier  . Diabetes Maternal Grandmother   . Uterine cancer Maternal Grandmother   . Lung cancer Paternal Grandmother   . Heart  disease Paternal Grandfather   . Hyperlipidemia Paternal Grandfather   . Hypertension Paternal Grandfather   . Crohn's disease Paternal Uncle   . Ulcerative colitis Paternal Uncle   . Lupus Maternal Aunt   . Cystic fibrosis Cousin   . Colon cancer Neg Hx   . Esophageal cancer Neg Hx   . Rectal cancer Neg Hx   . Stomach cancer Neg Hx   . Liver disease Neg Hx   . Pancreatic cancer Neg Hx     Social History   Socioeconomic History  . Marital status: Single    Spouse name: Not on file  . Number of children: 0  . Years of education: Not on file  . Highest education level: Not on file  Occupational History  . Occupation: retail  Tobacco Use  . Smoking status: Former  Smoker    Types: Cigarettes    Quit date: 05/19/2016    Years since quitting: 3.3  . Smokeless tobacco: Never Used  Substance and Sexual Activity  . Alcohol use: Yes    Alcohol/week: 3.0 standard drinks    Types: 3 Standard drinks or equivalent per week    Comment: occasional  . Drug use: Yes    Types: Marijuana  . Sexual activity: Yes    Birth control/protection: I.U.D.  Other Topics Concern  . Not on file  Social History Narrative  . Not on file   Social Determinants of Health   Financial Resource Strain:   . Difficulty of Paying Living Expenses:   Food Insecurity:   . Worried About Charity fundraiser in the Last Year:   . Arboriculturist in the Last Year:   Transportation Needs:   . Film/video editor (Medical):   Marland Kitchen Lack of Transportation (Non-Medical):   Physical Activity:   . Days of Exercise per Week:   . Minutes of Exercise per Session:   Stress:   . Feeling of Stress :   Social Connections:   . Frequency of Communication with Friends and Family:   . Frequency of Social Gatherings with Friends and Family:   . Attends Religious Services:   . Active Member of Clubs or Organizations:   . Attends Archivist Meetings:   Marland Kitchen Marital Status:   Intimate Partner Violence:   . Fear of Current or Ex-Partner:   . Emotionally Abused:   Marland Kitchen Physically Abused:   . Sexually Abused:     Review of Systems: ROS is O/W negative except as mentioned in HPI.  Physical Exam: Vital signs in last 24 hours: Temp:  [98.7 F (37.1 C)] 98.7 F (37.1 C) (06/01 0359) Pulse Rate:  [75-107] 75 (06/01 0743) Resp:  [18] 18 (06/01 0743) BP: (122-146)/(57-129) 122/57 (06/01 0743) SpO2:  [98 %-99 %] 99 % (06/01 0743) Weight:  [117.9 kg] 117.9 kg (06/01 0359)   General:  Alert, Well-developed, well-nourished, pleasant and cooperative in NAD Head:  Normocephalic and atraumatic. Eyes:  Sclera clear, no icterus.  Conjunctiva pink. Ears:  Normal auditory acuity. Mouth:  No  deformity or lesions.   Lungs:  Clear throughout to auscultation.  No wheezes, crackles, or rhonchi.  Heart:  Regular rate and rhythm; no murmurs, clicks, rubs, or gallops. Abdomen:  Soft, non-distended.  BS present.  Mild lower abdominal TTP. Rectal:  Deferred.  Object not felt by EDP.  Msk:  Symmetrical without gross deformities. Pulses:  Normal pulses noted. Extremities:  Without clubbing or edema. Neurologic:  Alert and oriented x 4;  grossly normal neurologically. Skin:  Intact without significant lesions or rashes. Psych:  Alert and cooperative. Normal mood and affect.  Lab Results: Recent Labs    10/07/19 0438  WBC 10.1  HGB 12.2  HCT 38.5  PLT 477*   BMET Recent Labs    10/07/19 0438  NA 139  K 4.5  CL 105  CO2 23  GLUCOSE 107*  BUN 11  CREATININE 0.60  CALCIUM 9.0   Studies/Results: DG Abdomen 1 View  Result Date: 10/07/2019 CLINICAL DATA:  Rectal foreign body EXAM: ABDOMEN - 1 VIEW COMPARISON:  11/12/2018 FINDINGS: Rectal foreign body with internal electronics, 8.6 cm in length. No evident fragmentation. Superimposed IUD.  Normal bowel gas pattern. IMPRESSION: Confirmed rectal foreign body. Electronically Signed   By: Marnee Spring M.D.   On: 10/07/2019 05:21   IMPRESSION:  *Rectal foreign:  Vibrating device got stuck around 3 AM.    PLAN: *Flexible sigmoidoscopy with removal of foreign body at noon today.  Likely discharge home from there.  Princella Pellegrini. Zehr  10/07/2019, 8:58 AM   Attending physician's note   I have taken a history, examined the patient and reviewed the chart. I agree with the Advanced Practitioner's note, impression and recommendations.  Plan to proceed with flex sigmoidoscopy for removal of foreign body in rectum The risks and benefits as well as alternatives of endoscopic procedure(s) have been discussed and reviewed. All questions answered. The patient agrees to proceed.    Iona Beard , MD [phone]

## 2019-10-07 NOTE — Anesthesia Postprocedure Evaluation (Signed)
Anesthesia Post Note  Patient: Wanda Garrett  Procedure(s) Performed: FLEXIBLE SIGMOIDOSCOPY (N/A )     Patient location during evaluation: Endoscopy Anesthesia Type: MAC Level of consciousness: awake Pain management: pain level controlled Vital Signs Assessment: post-procedure vital signs reviewed and stable Respiratory status: spontaneous breathing Cardiovascular status: stable Postop Assessment: no apparent nausea or vomiting Anesthetic complications: no    Last Vitals:  Vitals:   10/07/19 1225 10/07/19 1230  BP:  116/71  Pulse: 72 78  Resp: 19 18  Temp:    SpO2: 100% 99%    Last Pain:  Vitals:   10/07/19 1230  TempSrc:   PainSc: 0-No pain   Pain Goal:                   Huston Foley

## 2019-10-07 NOTE — Op Note (Addendum)
[facility] Patient Name: Wanda Garrett Procedure Date: 10/07/2019 MRN: [id number] Attending MD: Napoleon Form , MD Date of Birth: 08/27/1995 CSN: [id number] Age: 24 Admit Type: Emergency Department Procedure:                Flexible Sigmoidoscopy Indications:              Foreign body in the rectum, Abnormal abdominal                            x-ray of the GI tract Providers:                Napoleon Form, MD, Dayton Bailiff, RN, Beryle Beams, Technician, Leroy Libman, CRNA Referring MD:              Medicines:                Monitored Anesthesia Care Complications:            No immediate complications. Estimated Blood Loss:     Estimated blood loss: none. Procedure:                Pre-Anesthesia Assessment:                           - Prior to the procedure, a History and Physical                            was performed, and patient medications and                            allergies were reviewed. The patient's tolerance of                            previous anesthesia was also reviewed. The risks                            and benefits of the procedure and the sedation                            options and risks were discussed with the patient.                            All questions were answered, and informed consent                            was obtained. Prior Anticoagulants: The patient has                            taken no previous anticoagulant or antiplatelet                            agents. ASA Grade Assessment: I - A normal, healthy  patient. After reviewing the risks and benefits,                            the patient was deemed in satisfactory condition to                            undergo the procedure.                           After obtaining informed consent, the scope was                            passed under direct vision. The [id number]-[id number]   ([id number] Olympus pediatric colonscope was                            introduced through the anus and advanced to the the                            sigmoid colon. The flexible sigmoidoscopy was                            accomplished without difficulty. The patient                            tolerated the procedure well. The flexible                            sigmoidoscopy was somewhat difficult. The quality                            of the bowel preparation was unprep colon. Scope In: Scope Out: Findings:      The digital rectal exam findings include foreign body palpable.      A foreign body was found in the rectum. Removal of a toy was       accomplished with a rat-toothed forceps and snare.      The exam was otherwise without abnormality. Impression:               - Foreign body palpable found on digital rectal                            exam.                           - Foreign body in the rectum. Removal was                            successful.                           - The examination was otherwise normal. Moderate Sedation:      Not Applicable - Patient had care per Anesthesia. Recommendation:           - Discharge patient to home (ambulatory).                           -  Resume previous diet.                           - Continue present medications.                           - OK to discharge home Procedure Code(s):        --- Professional ---                           [phone], Sigmoidoscopy, flexible; with removal of                            foreign body(s) Diagnosis Code(s):        --- Professional ---                           L39.0ZES, Foreign body in anus and rectum, initial                            encounter                           R93.3, Abnormal findings on diagnostic imaging of                            other parts of digestive tract CPT copyright 2019 [employer]. All rights reserved. The codes documented in this report are preliminary  and upon coder review may  be revised to meet current compliance requirements. Mauri Pole, MD 10/07/2019 12:23:40 PM This report has been signed electronically. Number of Addenda: 0

## 2019-10-07 NOTE — ED Triage Notes (Signed)
Pt presents with a rectal foreign body. She reports that it was a butt plug used during sexual intercourse. States that it has been there for about 45 mins. States that it was not forcefully inserted. Her partner tried to retreive it with his fingers, but was unable to. Reports rectal cramps. Difficulty sitting.

## 2019-10-07 NOTE — Transfer of Care (Signed)
Immediate Anesthesia Transfer of Care Note  Patient: Wanda Garrett  Procedure(s) Performed: FLEXIBLE SIGMOIDOSCOPY (N/A )  Patient Location: Endoscopy Unit  Anesthesia Type:MAC  Level of Consciousness: awake and alert   Airway & Oxygen Therapy: Patient Spontanous Breathing  Post-op Assessment: Report given to RN and Post -op Vital signs reviewed and stable  Post vital signs: Reviewed and stable  Last Vitals:  Vitals Value Taken Time  BP    Temp    Pulse    Resp    SpO2      Last Pain:  Vitals:   10/07/19 1120  TempSrc: Oral  PainSc: 0-No pain         Complications: No apparent anesthesia complications

## 2019-10-07 NOTE — Discharge Instructions (Signed)
      [facility] ENDOSCOPY [address] [place], [place]  [address] Phone:  [phone]   October 07, 2019  Patient: Wanda Garrett  Date of Birth: [birth year]  Date of Visit: October 07, 2019    To Whom It May Concern:  Lakrisha Iseman was seen and treated on October 07, 2019.        If you have any questions or concerns, please don't hesitate to call.   Sincerely,   Dr.Nandigam

## 2019-10-08 ENCOUNTER — Encounter: Payer: Self-pay | Admitting: *Deleted

## 2019-10-29 DIAGNOSIS — R41840 Attention and concentration deficit: Secondary | ICD-10-CM | POA: Diagnosis not present

## 2019-10-29 DIAGNOSIS — F4312 Post-traumatic stress disorder, chronic: Secondary | ICD-10-CM | POA: Diagnosis not present

## 2019-10-29 DIAGNOSIS — F331 Major depressive disorder, recurrent, moderate: Secondary | ICD-10-CM | POA: Diagnosis not present

## 2019-11-27 DIAGNOSIS — R110 Nausea: Secondary | ICD-10-CM | POA: Diagnosis not present

## 2019-12-05 ENCOUNTER — Ambulatory Visit (INDEPENDENT_AMBULATORY_CARE_PROVIDER_SITE_OTHER): Payer: BC Managed Care – PPO | Admitting: Family Medicine

## 2019-12-05 ENCOUNTER — Other Ambulatory Visit: Payer: Self-pay

## 2019-12-05 ENCOUNTER — Encounter: Payer: Self-pay | Admitting: Family Medicine

## 2019-12-05 VITALS — BP 119/86 | HR 94 | Temp 98.1°F | Ht 65.0 in | Wt 264.0 lb

## 2019-12-05 DIAGNOSIS — R1012 Left upper quadrant pain: Secondary | ICD-10-CM

## 2019-12-05 DIAGNOSIS — E010 Iodine-deficiency related diffuse (endemic) goiter: Secondary | ICD-10-CM | POA: Diagnosis not present

## 2019-12-05 DIAGNOSIS — Z131 Encounter for screening for diabetes mellitus: Secondary | ICD-10-CM | POA: Diagnosis not present

## 2019-12-05 DIAGNOSIS — R609 Edema, unspecified: Secondary | ICD-10-CM | POA: Diagnosis not present

## 2019-12-05 NOTE — Progress Notes (Signed)
7/30/20214:39 PM  Wanda Garrett 06-28-1995, 24 y.o., female [id number]  Chief Complaint  Patient presents with  . Water retention    Pt stated that she has been noticied that her wt has been going up/dw for the past 4/5 months. She stated that she has been swelling in her hands, feet, arms, feet , and hips and also some light headedness    HPI:   Patient is a 24 y.o. 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 to avoid salt Tries to walk daily Worse in the heat Of recent having lightheadedness and nausea, fatigue after eating Overall feeling unwell this past week She has also noticed swelling of face and neck, no SOB UOP varies between 3-4 x day to q1 hour No blood, foamy, mal odor No abd pain but having increased stool frequency and gassiness No black tarry stools or bright red blood stools No rash or joint pain No chest pain, palpitations  Depression screen [facility] 2/9 12/05/2019 12/13/2018 07/08/2018  Decreased Interest 0 0 1  Down, Depressed, Hopeless 0 0 3  PHQ - 2 Score 0 0 4  Altered sleeping - - 1  Tired, decreased energy - - 3  Change in appetite - - 3  Feeling bad or failure about yourself  - - 2  Trouble concentrating - - 1  Moving slowly or fidgety/restless - - 0  Suicidal thoughts - - 0  PHQ-9 Score - - 14  Difficult doing work/chores - - Extremely dIfficult    Fall Risk  12/05/2019 12/13/2018 11/12/2018 07/08/2018 06/10/2018  Falls in the past year? 0 0 0 0 0  Number falls in past yr: 0 0 0 0 0  Injury with Fall? 0 0 0 0 1  Follow up Falls evaluation completed Falls evaluation completed - Falls evaluation completed -     Allergies  Allergen  Reactions  . Cymbalta [Duloxetine Hcl] Nausea Only  . Gluten Meal Nausea And Vomiting    Gluten intolerance     Prior to Admission medications   Medication Sig Start Date End Date Taking? Authorizing Provider  albuterol (PROVENTIL HFA;VENTOLIN HFA) 108 (90 Base) MCG/ACT inhaler Inhale 2 puffs into the lungs every 6 (six) hours as needed for wheezing or shortness of breath. 07/29/18  Yes Hawks, Christy A, FNP  buPROPion (WELLBUTRIN SR) 150 MG 12 hr tablet Take 150 mg by mouth daily.   Yes [provider]  Carboxymethylcellul-Glycerin (CLEAR EYES FOR DRY EYES OP) Apply 1-2 drops to eye daily as needed (dry eyes, itchy eyes).   Yes [provider]  fluticasone (FLONASE) 50 MCG/ACT nasal spray Place 1 spray into both nostrils 2 (two) times daily. Patient taking differently: Place 1-2 sprays into both nostrils at bedtime as needed for allergies.  06/10/18  Yes [name], MD  gabapentin (NEURONTIN) 100 MG capsule TAKE 1 CAPSULE (100 MG TOTAL) BY MOUTH DAILY AFTER LUNCH AND 3 CAPSULES (300 MG TOTAL) AT BEDTIME. 08/04/18  Yes [name], MD  gabapentin (NEURONTIN) 100 MG capsule Take 200-300 mg by mouth 2 (two) times daily as needed (pain).   Yes [provider]  ibuprofen (ADVIL) 200 MG tablet Take 400-600 mg by mouth every 6 (six) hours as  needed for mild pain or moderate pain.   Yes [provider]  levonorgestrel (MIRENA) 20 MCG/24HR IUD 1 each by Intrauterine route once. Placed by IUD 07/03/18  Yes [provider]  Lidocaine, Anorectal, 5 % CREA Apply 1 application topically 3 (three) times daily as needed. 10/07/19  Yes Nandigam, Eleonore Chiquito, MD  methylphenidate 27 MG PO CR tablet Take 27 mg by mouth daily.   Yes [provider]  OVER THE COUNTER MEDICATION Take 1 capsule by mouth 3 (three) times daily with meals as needed (gluten intolerance).   Yes [provider]    Past Medical History:  Diagnosis Date  . Anemia   . Anxiety    . Asthma   . Depression   . Fibromyalgia   . Gastritis   . Iron deficiency   . PTSD (post-traumatic stress disorder)   . Vitamin D insufficiency     Past Surgical History:  Procedure Laterality Date  . FLEXIBLE SIGMOIDOSCOPY N/A 10/07/2019   Procedure: FLEXIBLE SIGMOIDOSCOPY;  Surgeon: Napoleon Form, MD;  Location: [facility] ENDOSCOPY;  Service: Endoscopy;  Laterality: N/A;  . FOREIGN BODY REMOVAL  10/07/2019   Procedure: FOREIGN BODY REMOVAL;  Surgeon: Napoleon Form, MD;  Location: [facility] ENDOSCOPY;  Service: Endoscopy;;  . NO PAST SURGERIES      Social History   Tobacco Use  . Smoking status: Former Smoker    Types: Cigarettes    Quit date: 05/19/2016    Years since quitting: 3.5  . Smokeless tobacco: Never Used  Substance Use Topics  . Alcohol use: Yes    Alcohol/week: 3.0 standard drinks    Types: 3 Standard drinks or equivalent per week    Comment: occasional    Family History  Problem Relation Age of Onset  . Multiple sclerosis Mother   . Colon polyps Mother   . Sarcoidosis Mother   . Hyperlipidemia Father   . Hypertension Father   . Diverticulitis Father   . Other Father        cystic fibrosis carrier  . Celiac disease Sister   . Other Sister        cystic fibrosis carrier  . Diabetes Maternal Grandmother   . Uterine cancer Maternal Grandmother   . Lung cancer Paternal Grandmother   . Heart disease Paternal Grandfather   . Hyperlipidemia Paternal Grandfather   . Hypertension Paternal Grandfather   . Crohn's disease Paternal Uncle   . Ulcerative colitis Paternal Uncle   . Lupus Maternal Aunt   . Cystic fibrosis Cousin   . Colon cancer Neg Hx   . Esophageal cancer Neg Hx   . Rectal cancer Neg Hx   . Stomach cancer Neg Hx   . Liver disease Neg Hx   . Pancreatic cancer Neg Hx     ROS Per hpi  OBJECTIVE:  Today's Vitals   12/05/19 1457  BP: (!) 119/86  Pulse: 94  Temp: 98.1 F (36.7 C)  TempSrc: Temporal  SpO2: 95%  Weight: (!) 264 lb  (119.7 kg)  Height: 5\' 5"  (1.651 m)   Body mass index is 43.93 kg/m.   Wt Readings from Last 3 Encounters:  12/05/19 (!) 264 lb (119.7 kg)  10/07/19 259 lb 14.8 oz (117.9 kg)  01/31/19 242 lb 3.2 oz (109.9 kg)     Physical Exam Vitals and nursing note reviewed.  Constitutional:      Appearance: She is well-developed.  HENT:     Head: Normocephalic and atraumatic.  Mouth/Throat:     Pharynx: No oropharyngeal exudate.  Eyes:     General: No scleral icterus.    Extraocular Movements: Extraocular movements intact.     Conjunctiva/sclera: Conjunctivae normal.     Pupils: Pupils are equal, round, and reactive to light.  Neck:     Thyroid: Thyromegaly (right lobe, non tender) present.  Cardiovascular:     Rate and Rhythm: Normal rate and regular rhythm.     Heart sounds: Normal heart sounds. No murmur heard.  No friction rub. No gallop.   Pulmonary:     Effort: Pulmonary effort is normal.     Breath sounds: Normal breath sounds. No wheezing, rhonchi or rales.  Abdominal:     General: Bowel sounds are normal. There is no distension.     Palpations: Abdomen is soft. There is no hepatomegaly or splenomegaly.     Tenderness: There is abdominal tenderness in the left upper quadrant. There is no guarding or rebound.  Musculoskeletal:     Cervical back: Neck supple.     Right lower leg: Edema (pitting trace bilateral) present.     Left lower leg: Edema present.  Lymphadenopathy:     Cervical: No cervical adenopathy.  Skin:    General: Skin is warm and dry.  Neurological:     Mental Status: She is alert and oriented to person, place, and time.     No results found for this or any previous visit (from the past 24 hour(s)).  No results found.   ASSESSMENT and PLAN  1. Thyromegaly - TSH - T4, Free - US THYROID; Future  2. Edema, unspecified type - Urinalysis, Routine w reflex microscopic - Comprehensive metabolic panel - CBC  3. Screening for diabetes mellitus -  Hemoglobin A1c  4. LUQ abdominal pain No red flag signs. In setting of increased stools. CBC pending. Consider abx if WBC.  Discussed supportive measures. ER precautions given.   No follow-ups on file.    [name], MD Primary Care at [facility] [address] [place], [place] [address] Ph.  [phone] Fax [phone]

## 2019-12-05 NOTE — Patient Instructions (Signed)
° ° ° °  If you have lab work done today you will be contacted with your lab results within the next 2 weeks.  If you have not heard from us then please contact us. The fastest way to get your results is to register for My Chart. ° ° °IF you received an x-ray today, you will receive an invoice from [facility]. Please contact [facility] at [phone] with questions or concerns regarding your invoice.  ° °IF you received labwork today, you will receive an invoice from [facility]. Please contact [facility] at [phone] with questions or concerns regarding your invoice.  ° °Our billing staff will not be able to assist you with questions regarding bills from these companies. ° °You will be contacted with the lab results as soon as they are available. The fastest way to get your results is to activate your My Chart account. Instructions are located on the last page of this paperwork. If you have not heard from us regarding the results in 2 weeks, please contact this office. °  ° ° ° °

## 2019-12-06 LAB — COMPREHENSIVE METABOLIC PANEL
ALT: 17 IU/L (ref 0–32)
AST: 16 IU/L (ref 0–40)
Albumin/Globulin Ratio: 1.6 (ref 1.2–2.2)
Albumin: 4.5 g/dL (ref 3.9–5.0)
Alkaline Phosphatase: 115 IU/L (ref 48–121)
BUN/Creatinine Ratio: 16 (ref 9–23)
BUN: 10 mg/dL (ref 6–20)
Bilirubin Total: 0.3 mg/dL (ref 0.0–1.2)
CO2: 22 mmol/L (ref 20–29)
Calcium: 9.7 mg/dL (ref 8.7–10.2)
Chloride: 101 mmol/L (ref 96–106)
Creatinine, Ser: 0.61 mg/dL (ref 0.57–1.00)
GFR calc Af Amer: 146 mL/min/{1.73_m2} (ref >59)
GFR calc non Af Amer: 126 mL/min/{1.73_m2} (ref >59)
Globulin, Total: 2.8 g/dL (ref 1.5–4.5)
Glucose: 80 mg/dL (ref 65–99)
Potassium: 4.7 mmol/L (ref 3.5–5.2)
Sodium: 138 mmol/L (ref 134–144)
Total Protein: 7.3 g/dL (ref 6.0–8.5)

## 2019-12-06 LAB — HEMOGLOBIN A1C
Est. average glucose Bld gHb Est-mCnc: 108 mg/dL
Hgb A1c MFr Bld: 5.4 % (ref 4.8–5.6)

## 2019-12-06 LAB — URINALYSIS, ROUTINE W REFLEX MICROSCOPIC
Bilirubin, UA: NEGATIVE
Glucose, UA: NEGATIVE
Ketones, UA: NEGATIVE
Leukocytes,UA: NEGATIVE
Nitrite, UA: NEGATIVE
Protein,UA: NEGATIVE
RBC, UA: NEGATIVE
Specific Gravity, UA: 1.021 (ref 1.005–1.030)
Urobilinogen, Ur: 0.2 mg/dL (ref 0.2–1.0)
pH, UA: 5.5 (ref 5.0–7.5)

## 2019-12-06 LAB — T4, FREE: Free T4: 1.33 ng/dL (ref 0.82–1.77)

## 2019-12-06 LAB — CBC
Hematocrit: 39 % (ref 34.0–46.6)
Hemoglobin: 12 g/dL (ref 11.1–15.9)
MCH: 23.3 pg — ABNORMAL LOW (ref 26.6–33.0)
MCHC: 30.8 g/dL — ABNORMAL LOW (ref 31.5–35.7)
MCV: 76 fL — ABNORMAL LOW (ref 79–97)
Platelets: 409 10*3/uL (ref 150–450)
RBC: 5.16 x10E6/uL (ref 3.77–5.28)
RDW: 15 % (ref 11.7–15.4)
WBC: 10.5 10*3/uL (ref 3.4–10.8)

## 2019-12-06 LAB — TSH: TSH: 1.66 u[IU]/mL (ref 0.450–4.500)

## 2019-12-12 ENCOUNTER — Ambulatory Visit
Admission: RE | Admit: 2019-12-12 | Discharge: 2019-12-12 | Disposition: A | Discharge status: Home / Self Care | Payer: BC Managed Care – PPO | Source: Ambulatory Visit | Attending: Family Medicine | Admitting: Family Medicine

## 2019-12-12 ENCOUNTER — Other Ambulatory Visit: Payer: Self-pay

## 2019-12-12 DIAGNOSIS — E010 Iodine-deficiency related diffuse (endemic) goiter: Secondary | ICD-10-CM

## 2019-12-30 DIAGNOSIS — G479 Sleep disorder, unspecified: Secondary | ICD-10-CM | POA: Diagnosis not present

## 2020-02-02 DIAGNOSIS — R41840 Attention and concentration deficit: Secondary | ICD-10-CM | POA: Diagnosis not present

## 2020-02-02 DIAGNOSIS — F331 Major depressive disorder, recurrent, moderate: Secondary | ICD-10-CM | POA: Diagnosis not present

## 2020-02-02 DIAGNOSIS — F4312 Post-traumatic stress disorder, chronic: Secondary | ICD-10-CM | POA: Diagnosis not present

## 2020-02-15 ENCOUNTER — Ambulatory Visit
Admission: EM | Admit: 2020-02-15 | Discharge: 2020-02-15 | Disposition: A | Discharge status: Home / Self Care | Payer: BC Managed Care – PPO | Attending: Emergency Medicine | Admitting: Emergency Medicine

## 2020-02-15 ENCOUNTER — Encounter: Payer: Self-pay | Admitting: Emergency Medicine

## 2020-02-15 ENCOUNTER — Other Ambulatory Visit: Payer: Self-pay

## 2020-02-15 DIAGNOSIS — M545 Low back pain, unspecified: Secondary | ICD-10-CM | POA: Diagnosis not present

## 2020-02-15 DIAGNOSIS — B349 Viral infection, unspecified: Secondary | ICD-10-CM | POA: Diagnosis not present

## 2020-02-15 DIAGNOSIS — Z3202 Encounter for pregnancy test, result negative: Secondary | ICD-10-CM | POA: Diagnosis not present

## 2020-02-15 DIAGNOSIS — R300 Dysuria: Secondary | ICD-10-CM | POA: Diagnosis not present

## 2020-02-15 DIAGNOSIS — Z20822 Contact with and (suspected) exposure to covid-19: Secondary | ICD-10-CM | POA: Diagnosis not present

## 2020-02-15 LAB — POCT URINE PREGNANCY: Preg Test, Ur: NEGATIVE

## 2020-02-15 LAB — POCT URINALYSIS DIP (MANUAL ENTRY)
Bilirubin, UA: NEGATIVE
Blood, UA: NEGATIVE
Glucose, UA: NEGATIVE mg/dL
Ketones, POC UA: NEGATIVE mg/dL
Leukocytes, UA: NEGATIVE
Nitrite, UA: NEGATIVE
Protein Ur, POC: NEGATIVE mg/dL
Spec Grav, UA: 1.02 (ref 1.010–1.025)
Urobilinogen, UA: 0.2 E.U./dL
pH, UA: 7 (ref 5.0–8.0)

## 2020-02-15 MED ORDER — MELOXICAM 7.5 MG PO TABS
7.5000 mg | ORAL_TABLET | Freq: Every day | ORAL | 0 refills | Status: AC
Start: 2020-02-15 — End: ?

## 2020-02-15 NOTE — ED Provider Notes (Signed)
[facility]    CSN: [id number] Arrival date & time: 02/15/20  0808      History   Chief Complaint Chief Complaint  Patient presents with  . Back Pain  . Dysuria    HPI Wanda Garrett is a 24 y.o. female  Presents for bilateral low back pain, left more than right, that began last night.  States this is preceded by a "pulling sensation "near her bladder when she was urinating.  Denies burning with urination, frequency, urgency, malodor.  No hematuria, vaginal discharge, pelvic pain.  Denies fever, history of renal calculi.  Has not taken thing for this.  Past Medical History:  Diagnosis Date  . Anemia   . Anxiety   . Asthma   . Depression   . Fibromyalgia   . Gastritis   . Iron deficiency   . PTSD (post-traumatic stress disorder)   . Vitamin D insufficiency     Patient Active Problem List   Diagnosis Date Noted  . Foreign body of rectum   . Fibromyalgia 04/29/2018  . Vitamin D deficiency 12/26/2017  . PTSD (post-traumatic stress disorder) 12/07/2017  . Anxiety and depression 12/07/2017  . History of anemia 12/07/2017  . History of asthma 12/07/2017  . Class 2 obesity with body mass index (BMI) of 39.0 to 39.9 in adult 11/16/2017    Past Surgical History:  Procedure Laterality Date  . FLEXIBLE SIGMOIDOSCOPY N/A 10/07/2019   Procedure: FLEXIBLE SIGMOIDOSCOPY;  Surgeon: Napoleon Form, MD;  Location: [facility] ENDOS[facility];  Service: Endoscopy;  Laterality: N/A;  . FOREIGN BODY REMOVAL  10/07/2019   Procedure: FOREIGN BODY REMOVAL;  Surgeon: Napoleon Form, MD;  Location: [facility];  Service: Endoscopy;;  . NO PAST SURGERIES      OB History   No obstetric history on file.      Home Medications    Prior to Admission medications   Medication Sig Start Date End Date Taking? Authorizing Provider  albuterol (PROVENTIL HFA;VENTOLIN HFA) 108 (90 Base) MCG/ACT inhaler Inhale 2 puffs into the lungs every 6 (six) hours as needed for wheezing or shortness  of breath. 07/29/18   Jannifer Rodney A, FNP  buPROPion (WELLBUTRIN SR) 150 MG 12 hr tablet Take 150 mg by mouth daily.    [provider]  Carboxymethylcellul-Glycerin (CLEAR EYES FOR DRY EYES OP) Apply 1-2 drops to eye daily as needed (dry eyes, itchy eyes).    [provider]  fluticasone (FLONASE) 50 MCG/ACT nasal spray Place 1 spray into both nostrils 2 (two) times daily. Patient taking differently: Place 1-2 sprays into both nostrils at bedtime as needed for allergies.  06/10/18   [name], MD  gabapentin (NEURONTIN) 100 MG capsule TAKE 1 CAPSULE (100 MG TOTAL) BY MOUTH DAILY AFTER LUNCH AND 3 CAPSULES (300 MG TOTAL) AT BEDTIME. 08/04/18   [name], MD  gabapentin (NEURONTIN) 100 MG capsule Take 200-300 mg by mouth 2 (two) times daily as needed (pain).    [provider]  ibuprofen (ADVIL) 200 MG tablet Take 400-600 mg by mouth every 6 (six) hours as needed for mild pain or moderate pain.    [provider]  levonorgestrel (MIRENA) 20 MCG/24HR IUD 1 each by Intrauterine route once. Placed by IUD 07/03/18   [provider]  Lidocaine, Anorectal, 5 % CREA Apply 1 application topically 3 (three) times daily as needed. 10/07/19   Napoleon Form, MD  meloxicam (MOBIC) 7.5 MG tablet Take 1 tablet (7.5 mg total)  by mouth daily. 02/15/20   Hall-Potvin, [place], PA-C  methylphenidate 27 MG PO CR tablet Take 27 mg by mouth daily.    [provider]  OVER THE COUNTER MEDICATION Take 1 capsule by mouth 3 (three) times daily with meals as needed (gluten intolerance).    [provider]    Family History Family History  Problem Relation Age of Onset  . Multiple sclerosis Mother   . Colon polyps Mother   . Sarcoidosis Mother   . Hyperlipidemia Father   . Hypertension Father   . Diverticulitis Father   . Other Father        cystic fibrosis carrier  . Celiac disease Sister   . Other Sister        cystic fibrosis carrier  .  Diabetes Maternal Grandmother   . Uterine cancer Maternal Grandmother   . Lung cancer Paternal Grandmother   . Heart disease Paternal Grandfather   . Hyperlipidemia Paternal Grandfather   . Hypertension Paternal Grandfather   . Crohn's disease Paternal Uncle   . Ulcerative colitis Paternal Uncle   . Lupus Maternal Aunt   . Cystic fibrosis Cousin   . Colon cancer Neg Hx   . Esophageal cancer Neg Hx   . Rectal cancer Neg Hx   . Stomach cancer Neg Hx   . Liver disease Neg Hx   . Pancreatic cancer Neg Hx     Social History Social History   Tobacco Use  . Smoking status: Former Smoker    Types: Cigarettes    Quit date: 05/19/2016    Years since quitting: 3.7  . Smokeless tobacco: Never Used  Vaping Use  . Vaping Use: Never used  Substance Use Topics  . Alcohol use: Yes    Alcohol/week: 3.0 standard drinks    Types: 3 Standard drinks or equivalent per week    Comment: occasional  . Drug use: Yes    Types: Marijuana     Allergies   Cymbalta [duloxetine hcl] and Gluten meal   Review of Systems As per HPI   Physical Exam Triage Vital Signs ED Triage Vitals  Enc Vitals Group     BP      Pulse      Resp      Temp      Temp src      SpO2      Weight      Height      Head Circumference      Peak Flow      Pain Score      Pain Loc      Pain Edu?      Excl. in GC?    No data found.  Updated Vital Signs BP 135/83 (BP Location: Left Arm)   Pulse 81   Temp 98.1 F (36.7 C) (Oral)   Resp 18   SpO2 96%   Visual Acuity Right Eye Distance:   Left Eye Distance:   Bilateral Distance:    Right Eye Near:   Left Eye Near:    Bilateral Near:     Physical Exam Constitutional:      General: She is not in acute distress. HENT:     Head: Normocephalic and atraumatic.  Eyes:     General: No scleral icterus.    Pupils: Pupils are equal, round, and reactive to light.  Cardiovascular:     Rate and Rhythm: Normal rate.  Pulmonary:     Effort: Pulmonary effort  is normal.  Musculoskeletal:     Comments: Diffuse lumbar back pain bilaterally that spares spinous process, PSIS.  No mass, fluctuance appreciated or muscle spasm.  Patient also has mild left thoracic pain as well without crepitus.  Skin:    Coloration: Skin is not jaundiced or pale.  Neurological:     Mental Status: She is alert and oriented to person, place, and time.      UC Treatments / Results  Labs (all labs ordered are listed, but only abnormal results are displayed) Labs Reviewed  POCT URINALYSIS DIP (MANUAL ENTRY)  POCT URINE PREGNANCY    EKG   Radiology No results found.  Procedures Procedures (including critical care time)  Medications Ordered in UC Medications - No data to display  Initial Impression / Assessment and Plan / UC Course  I have reviewed the triage vital signs and the nursing notes.  Pertinent labs & imaging results that were available during my care of the patient were reviewed by me and considered in my medical decision making (see chart for details).     Urine pregnancy negative, urine dipstick unremarkable as above.  Will treat likely MSK back pain as outlined below.  She declined STD testing.  Return precautions discussed, pt verbalized understanding and is agreeable to plan. Final Clinical Impressions(s) / UC Diagnoses   Final diagnoses:  Acute bilateral low back pain without sciatica  Dysuria     Discharge Instructions     Take mobic daily with food. Important to drink plenty of water throughout the day. Return for worsening urinary symptoms, blood in urine, abdominal or back pain, fever.    ED Prescriptions    Medication Sig Dispense Auth. Provider   meloxicam (MOBIC) 7.5 MG tablet Take 1 tablet (7.5 mg total) by mouth daily. 20 tablet Hall-Potvin, [place], PA-C     PDMP not reviewed this encounter.   Odette Fraction [place], [place] 02/15/20 [phone]

## 2020-02-15 NOTE — ED Triage Notes (Signed)
Pt here for lower back pain starting last night with dysuria starting yesterday

## 2020-02-15 NOTE — Discharge Instructions (Addendum)
Take mobic daily with food. Important to drink plenty of water throughout the day. Return for worsening urinary symptoms, blood in urine, abdominal or back pain, fever.

## 2020-02-19 DIAGNOSIS — R300 Dysuria: Secondary | ICD-10-CM | POA: Diagnosis not present

## 2020-03-01 DIAGNOSIS — R41840 Attention and concentration deficit: Secondary | ICD-10-CM | POA: Diagnosis not present

## 2020-03-01 DIAGNOSIS — F331 Major depressive disorder, recurrent, moderate: Secondary | ICD-10-CM | POA: Diagnosis not present

## 2020-03-01 DIAGNOSIS — F4312 Post-traumatic stress disorder, chronic: Secondary | ICD-10-CM | POA: Diagnosis not present

## 2020-03-01 DIAGNOSIS — F1421 Cocaine dependence, in remission: Secondary | ICD-10-CM | POA: Diagnosis not present

## 2020-04-15 DIAGNOSIS — F4312 Post-traumatic stress disorder, chronic: Secondary | ICD-10-CM | POA: Diagnosis not present

## 2020-04-15 DIAGNOSIS — F331 Major depressive disorder, recurrent, moderate: Secondary | ICD-10-CM | POA: Diagnosis not present

## 2020-04-15 DIAGNOSIS — R41840 Attention and concentration deficit: Secondary | ICD-10-CM | POA: Diagnosis not present

## 2020-08-16 ENCOUNTER — Encounter: Payer: Self-pay | Admitting: Emergency Medicine

## 2020-08-16 ENCOUNTER — Ambulatory Visit
Admission: EM | Admit: 2020-08-16 | Discharge: 2020-08-16 | Disposition: A | Discharge status: Home / Self Care | Payer: 59 | Attending: Emergency Medicine | Admitting: Emergency Medicine

## 2020-08-16 ENCOUNTER — Other Ambulatory Visit: Payer: Self-pay

## 2020-08-16 DIAGNOSIS — J36 Peritonsillar abscess: Secondary | ICD-10-CM

## 2020-08-16 LAB — POCT RAPID STREP A (OFFICE): Rapid Strep A Screen: NEGATIVE

## 2020-08-16 MED ORDER — PREDNISONE 50 MG PO TABS: 50.0000 mg | ORAL_TABLET | Freq: Every day | ORAL | 0 refills | Status: AC

## 2020-08-16 MED ORDER — DEXAMETHASONE 10 MG/ML FOR PEDIATRIC ORAL USE
10.0000 mg | Freq: Once | INTRAMUSCULAR | Status: AC
  Administered 2020-08-16: 10 mg via ORAL

## 2020-08-16 MED ORDER — AMOXICILLIN-POT CLAVULANATE 875-125 MG PO TABS: 1.0000 | ORAL_TABLET | Freq: Two times a day (BID) | ORAL | 0 refills | Status: AC

## 2020-08-16 MED ORDER — CEFTRIAXONE SODIUM 1 G IJ SOLR
1.0000 g | Freq: Once | INTRAMUSCULAR | Status: AC
  Administered 2020-08-16: 1 g via INTRAMUSCULAR

## 2020-08-16 NOTE — Discharge Instructions (Addendum)
We gave you a shot of Rocephin and 1 dose of Decadron to help with swelling Continue with Augmentin twice daily for 10 days Prednisone daily with food to further help with swelling If not seeing any improvement or worsening swelling, difficulty breathing please go to the emergency room

## 2020-08-16 NOTE — ED Provider Notes (Signed)
[facility]    CSN: [id number] Arrival date & time: 08/16/20  1104      History   Chief Complaint Chief Complaint  Patient presents with  . Sore Throat    HPI Wanda Galasheresa Bonsignore is a 25 y.o. female presenting today for evaluation of sore throat.  Reports sore throat x1 week, but worse in the past 2 days.  Reports prior Covid testing which was negative.  Reports increased swelling noted on right side of tonsils and reports difficulty opening mouth.  Denies any difficulty breathing or shortness of breath.  Denies fevers. HPI  Past Medical History:  Diagnosis Date  . Anemia   . Anxiety   . Asthma   . Depression   . Fibromyalgia   . Gastritis   . Iron deficiency   . PTSD (post-traumatic stress disorder)   . Vitamin D insufficiency     Patient Active Problem List   Diagnosis Date Noted  . Foreign body of rectum   . Fibromyalgia 04/29/2018  . Vitamin D deficiency 12/26/2017  . PTSD (post-traumatic stress disorder) 12/07/2017  . Anxiety and depression 12/07/2017  . History of anemia 12/07/2017  . History of asthma 12/07/2017  . Class 2 obesity with body mass index (BMI) of 39.0 to 39.9 in adult 11/16/2017    Past Surgical History:  Procedure Laterality Date  . FLEXIBLE SIGMOIDOSCOPY N/A 10/07/2019   Procedure: FLEXIBLE SIGMOIDOSCOPY;  Surgeon: Napoleon FormNandigam, Kavitha V, MD;  Location: [facility];  Service: Endoscopy;  Laterality: N/A;  . FOREIGN BODY REMOVAL  10/07/2019   Procedure: FOREIGN BODY REMOVAL;  Surgeon: Napoleon FormNandigam, Kavitha V, MD;  Location: [facility];  Service: Endoscopy;;  . NO PAST SURGERIES      OB History   No obstetric history on file.      Home Medications    Prior to Admission medications   Medication Sig Start Date End Date Taking? Authorizing Provider  amoxicillin-clavulanate (AUGMENTIN) 875-125 MG tablet Take 1 tablet by mouth every 12 (twelve) hours for 10 days. 08/16/20 08/26/20 Yes Albena Comes C, PA-C  predniSONE (DELTASONE) 50 MG  tablet Take 1 tablet (50 mg total) by mouth daily with breakfast for 5 days. 08/16/20 08/21/20 Yes Jacqulene Huntley C, PA-C  albuterol (PROVENTIL HFA;VENTOLIN HFA) 108 (90 Base) MCG/ACT inhaler Inhale 2 puffs into the lungs every 6 (six) hours as needed for wheezing or shortness of breath. 07/29/18   Jannifer RodneyHawks, Christy A, FNP  buPROPion (WELLBUTRIN SR) 150 MG 12 hr tablet Take 150 mg by mouth daily.    [provider]  Carboxymethylcellul-Glycerin (CLEAR EYES FOR DRY EYES OP) Apply 1-2 drops to eye daily as needed (dry eyes, itchy eyes).    [provider]  fluticasone (FLONASE) 50 MCG/ACT nasal spray Place 1 spray into both nostrils 2 (two) times daily. Patient taking differently: Place 1-2 sprays into both nostrils at bedtime as needed for allergies.  06/10/18   Lezlie LyeSantiago Lago, Meda CoffeeIrma M, MD  gabapentin (NEURONTIN) 100 MG capsule TAKE 1 CAPSULE (100 MG TOTAL) BY MOUTH DAILY AFTER LUNCH AND 3 CAPSULES (300 MG TOTAL) AT BEDTIME. 08/04/18   Lezlie LyeSantiago Lago, Meda CoffeeIrma M, MD  gabapentin (NEURONTIN) 100 MG capsule Take 200-300 mg by mouth 2 (two) times daily as needed (pain).    [provider]  ibuprofen (ADVIL) 200 MG tablet Take 400-600 mg by mouth every 6 (six) hours as needed for mild pain or moderate pain.    [provider]  levonorgestrel (MIRENA) 20 MCG/24HR IUD 1 each by  Intrauterine route once. Placed by IUD 07/03/18   [provider]  Lidocaine, Anorectal, 5 % CREA Apply 1 application topically 3 (three) times daily as needed. 10/07/19   Napoleon Form, MD  meloxicam (MOBIC) 7.5 MG tablet Take 1 tablet (7.5 mg total) by mouth daily. 02/15/20   Hall-Potvin, [place], PA-C  methylphenidate 27 MG PO CR tablet Take 27 mg by mouth daily.    [provider]  OVER THE COUNTER MEDICATION Take 1 capsule by mouth 3 (three) times daily with meals as needed (gluten intolerance).    [provider]    Family History Family History  Problem Relation Age of Onset   . Multiple sclerosis Mother   . Colon polyps Mother   . Sarcoidosis Mother   . Hyperlipidemia Father   . Hypertension Father   . Diverticulitis Father   . Other Father        cystic fibrosis carrier  . Celiac disease Sister   . Other Sister        cystic fibrosis carrier  . Diabetes Maternal Grandmother   . Uterine cancer Maternal Grandmother   . Lung cancer Paternal Grandmother   . Heart disease Paternal Grandfather   . Hyperlipidemia Paternal Grandfather   . Hypertension Paternal Grandfather   . Crohn's disease Paternal Uncle   . Ulcerative colitis Paternal Uncle   . Lupus Maternal Aunt   . Cystic fibrosis Cousin   . Colon cancer Neg Hx   . Esophageal cancer Neg Hx   . Rectal cancer Neg Hx   . Stomach cancer Neg Hx   . Liver disease Neg Hx   . Pancreatic cancer Neg Hx     Social History Social History   Tobacco Use  . Smoking status: Former Smoker    Types: Cigarettes    Quit date: 05/19/2016    Years since quitting: 4.2  . Smokeless tobacco: Never Used  Vaping Use  . Vaping Use: Never used  Substance Use Topics  . Alcohol use: Yes    Alcohol/week: 3.0 standard drinks    Types: 3 Standard drinks or equivalent per week    Comment: occasional  . Drug use: Yes    Types: Marijuana     Allergies   Cymbalta [duloxetine hcl] and Gluten meal   Review of Systems Review of Systems  Constitutional: Negative for activity change, appetite change, chills, fatigue and fever.  HENT: Positive for congestion and sore throat. Negative for ear pain, rhinorrhea, sinus pressure and trouble swallowing.   Eyes: Negative for discharge and redness.  Respiratory: Positive for cough. Negative for chest tightness and shortness of breath.   Cardiovascular: Negative for chest pain.  Gastrointestinal: Negative for abdominal pain, diarrhea, nausea and vomiting.  Musculoskeletal: Negative for myalgias.  Skin: Negative for rash.  Neurological: Negative for dizziness, light-headedness  and headaches.     Physical Exam Triage Vital Signs ED Triage Vitals [08/16/20 1145]  Enc Vitals Group     BP (!) 145/91     Pulse Rate 98     Resp 18     Temp 97.7 F (36.5 C)     Temp Source Oral     SpO2 98 %     Weight      Height      Head Circumference      Peak Flow      Pain Score 10     Pain Loc      Pain Edu?  Excl. in GC?    No data found.  Updated Vital Signs BP (!) 145/91 (BP Location: Left Arm)   Pulse 98   Temp 97.7 F (36.5 C) (Oral)   Resp 18   SpO2 98%   Visual Acuity Right Eye Distance:   Left Eye Distance:   Bilateral Distance:    Right Eye Near:   Left Eye Near:    Bilateral Near:     Physical Exam Vitals and nursing note reviewed.  Constitutional:      Appearance: She is well-developed.     Comments: No acute distress  HENT:     Head: Normocephalic and atraumatic.     Ears:     Comments: Bilateral ears without tenderness to palpation of external auricle, tragus and mastoid, EAC's without erythema or swelling, TM's with good bony landmarks and cone of light. Non erythematous.     Nose: Nose normal.     Mouth/Throat:     Comments: Limiting range of motion of jaw, does appear to have right-sided soft palate swelling and erythema, no significant tonsillar erythema enlargement or exudate, posterior pharynx patent, uvula midline Eyes:     Conjunctiva/sclera: Conjunctivae normal.  Cardiovascular:     Rate and Rhythm: Normal rate and regular rhythm.  Pulmonary:     Effort: Pulmonary effort is normal. No respiratory distress.     Comments: Breathing comfortably at rest, CTABL, no wheezing, rales or other adventitious sounds auscultated Abdominal:     General: There is no distension.  Musculoskeletal:        General: Normal range of motion.     Cervical back: Neck supple.  Skin:    General: Skin is warm and dry.  Neurological:     Mental Status: She is alert and oriented to person, place, and time.      UC Treatments /  Results  Labs (all labs ordered are listed, but only abnormal results are displayed) Labs Reviewed  CULTURE, GROUP A STREP [facility])  POCT RAPID STREP A (OFFICE)    EKG   Radiology No results found.  Procedures Procedures (including critical care time)  Medications Ordered in UC Medications  cefTRIAXone (ROCEPHIN) injection 1 g (1 g Intramuscular Given 08/16/20 1238)  dexamethasone (DECADRON) 10 MG/ML injection for Pediatric ORAL use 10 mg (10 mg Oral Given 08/16/20 1238)    Initial Impression / Assessment and Plan / UC Course  I have reviewed the triage vital signs and the nursing notes.  Pertinent labs & imaging results that were available during my care of the patient were reviewed by me and considered in my medical decision making (see chart for details).    Strep negative, no significant tonsil swelling, but does appear to have some soft palate swelling and erythema and given patient's limited range of motion opted to go ahead and treat for peritonsillar abscess and providing Rocephin prior to discharge and continuing on Augmentin, Decadron prior to discharge and prednisone to continue to help with swelling.  Continue to monitor, to emergency room if symptoms progressing or worsening  Discussed strict return precautions. Patient verbalized understanding and is agreeable with plan.    Final Clinical Impressions(s) / UC Diagnoses   Final diagnoses:  Peritonsillitis     Discharge Instructions     We gave you a shot of Rocephin and 1 dose of Decadron to help with swelling Continue with Augmentin twice daily for 10 days Prednisone daily with food to further help with swelling If not seeing any improvement  or worsening swelling, difficulty breathing please go to the emergency room    ED Prescriptions    Medication Sig Dispense Auth. Provider   amoxicillin-clavulanate (AUGMENTIN) 875-125 MG tablet Take 1 tablet by mouth every 12 (twelve) hours for 10 days. 20 tablet  Shellyann Wandrey C, PA-C   predniSONE (DELTASONE) 50 MG tablet Take 1 tablet (50 mg total) by mouth daily with breakfast for 5 days. 5 tablet Sundra Haddix, [place] C, PA-C     PDMP not reviewed this encounter.   Lew Dawes, PA-C 08/16/20 1318

## 2020-08-16 NOTE — ED Triage Notes (Signed)
Pt here for sore throat x 1 week that was more severe x 2 days; pt had negative covid test

## 2020-08-19 LAB — CULTURE, GROUP A STREP (THRC)

## 2020-08-25 ENCOUNTER — Telehealth (HOSPITAL_BASED_OUTPATIENT_CLINIC_OR_DEPARTMENT_OTHER): Payer: Self-pay

## 2020-08-25 NOTE — Telephone Encounter (Signed)
LVM - My Chart scheduling issue - please reschedule

## 2020-08-30 ENCOUNTER — Ambulatory Visit (HOSPITAL_BASED_OUTPATIENT_CLINIC_OR_DEPARTMENT_OTHER): Payer: 59 | Admitting: Family Medicine

## 2021-04-15 IMAGING — CR DG ABDOMEN 1V
1 series · 1 of 1 positions shown · non-contrast
Comparison: 11/12/2018

CLINICAL DATA: Rectal foreign body

EXAM:
ABDOMEN - 1 VIEW

[t abdomen supine]
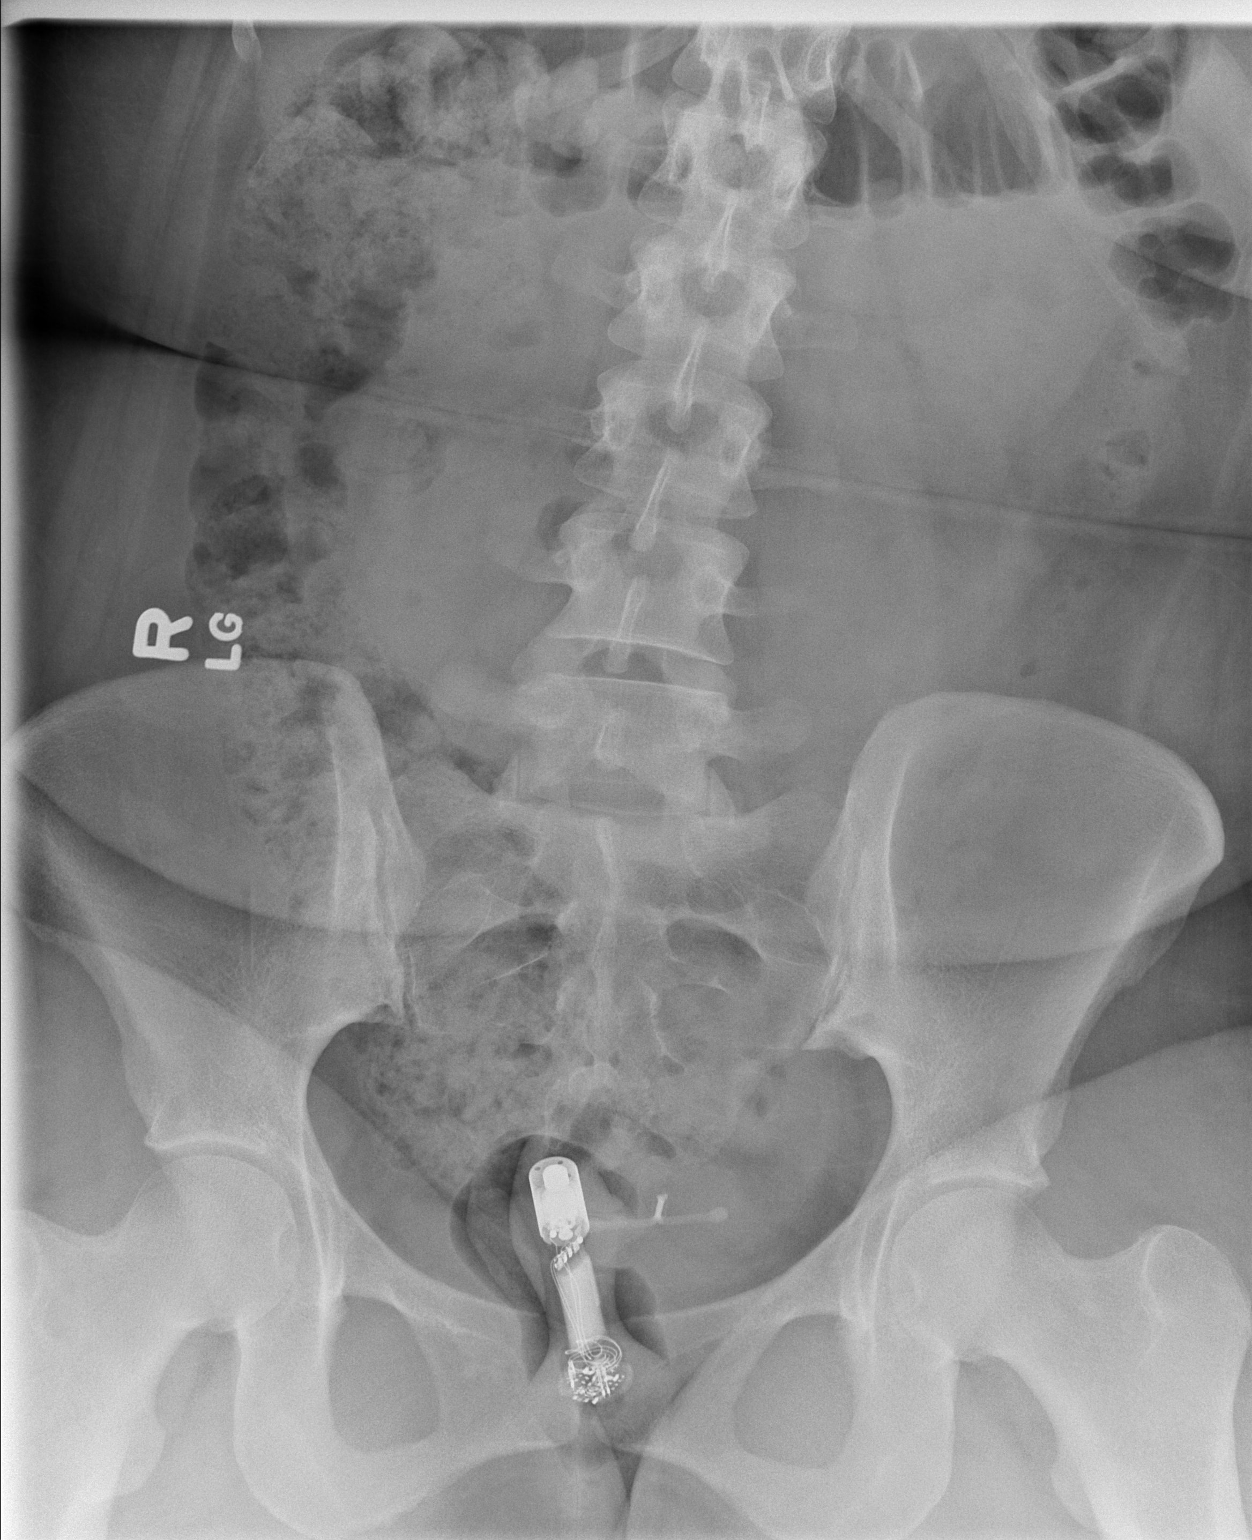

[1 of 1 positions shown; findings below may reference images not displayed]

FINDINGS: Rectal foreign body with internal electronics, 8.6 cm in length. No
evident fragmentation.

Superimposed IUD.  Normal bowel gas pattern.
IMPRESSION: Confirmed rectal foreign body.
